# Patient Record
Sex: Male | Born: 1980 | Race: Black or African American | Hispanic: No | Marital: Single | Smoking: Never smoker
Health system: Southern US, Community
[De-identification: ages and names within clinical notes are randomized; demographics above are authoritative.]

## PROBLEM LIST (undated history)

## (undated) DIAGNOSIS — E785 Hyperlipidemia, unspecified: Secondary | ICD-10-CM

## (undated) DIAGNOSIS — Z87442 Personal history of urinary calculi: Secondary | ICD-10-CM

## (undated) HISTORY — PX: LACERATION REPAIR: SHX5168

## (undated) HISTORY — DX: Hyperlipidemia, unspecified: E78.5

---

## 2013-12-08 ENCOUNTER — Encounter: Payer: Self-pay | Admitting: Medical

## 2013-12-08 ENCOUNTER — Ambulatory Visit (INDEPENDENT_AMBULATORY_CARE_PROVIDER_SITE_OTHER): Payer: 59 | Admitting: Medical

## 2013-12-08 VITALS — BP 120/90 | HR 72 | Temp 98.0°F

## 2013-12-08 DIAGNOSIS — M5431 Sciatica, right side: Secondary | ICD-10-CM

## 2013-12-08 DIAGNOSIS — M549 Dorsalgia, unspecified: Secondary | ICD-10-CM

## 2013-12-08 DIAGNOSIS — M5430 Sciatica, unspecified side: Secondary | ICD-10-CM

## 2013-12-08 DIAGNOSIS — R3129 Other microscopic hematuria: Secondary | ICD-10-CM

## 2013-12-08 LAB — POCT URINALYSIS DIPSTICK
Bilirubin, UA: NEGATIVE
Glucose, UA: NEGATIVE
Nitrite, UA: NEGATIVE
Urobilinogen, UA: NEGATIVE

## 2013-12-08 MED ORDER — CYCLOBENZAPRINE HCL 10 MG PO TABS: 10.0000 mg | ORAL_TABLET | Freq: Every day | ORAL | Status: DC

## 2013-12-08 MED ORDER — NAPROXEN 500 MG PO TABS: 500.0000 mg | ORAL_TABLET | Freq: Two times a day (BID) | ORAL | Status: DC

## 2013-12-08 NOTE — Progress Notes (Signed)
  Subjective:  Terrence Santiago is a 32 y.o. male who presents as a new patient today.    He reports pain in right low back over a week ago, earlier in this week had worse pain, but a little better today.  Pain is sharp, worse with raising right leg, worse with back flexion and extension.  Pain worse right in right buttock.  Had some pain in back of right upper leg one day, but none other.   Denies numbness or tingling in leg.   Denies injury or trauma.  He does exercise and works out.  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  Using some Bayer aspirin, no other aggravating or relieving factors.  He notes similar before, but not this bad.  No other c/o.  The following portions of the patient's history were reviewed and updated as appropriate: allergies, current medications, past family history, past medical history, past social history, past surgical history and problem list.  Review of Systems Constitutional: -fever, -chills, -sweats, -unexpected weight change,-fatigue ENT: -runny nose, -ear pain, -sore throat Cardiology:  -chest pain, -palpitations, -edema Respiratory: -cough, -shortness of breath, -wheezing Gastroenterology: -abdominal pain, -nausea, -vomiting, -diarrhea, -constipation  Hematology: -bleeding or bruising problems Musculoskeletal: -arthralgias, -joint swelling Ophthalmology: -vision changes Urology: -dysuria, -difficulty urinating, -hematuria, -urinary frequency, -urgency Neurology: -headache, -weakness, -tingling, -numbness    Objective: Physical Exam  BP 120/90  Pulse 72  Temp(Src) 98 F (36.7 C) (Oral)   General appearance: alert, no distress, WD/WN, lean AA male Abdomen: +bs, soft, non tender, non distended, no masses, no hepatomegaly, no splenomegaly Back: nontender, normal ROM, but he does note some right low back pain with flexion and right leg extension, no scoliosis Pulses: 2+ radial pulses, 2+ pedal  pulses, normal cap refill Ext: no edema Neuro: normal LE strength, sensation, DTRs, heel and toe walk, -SLR   Assessment: Encounter Diagnoses  Name Primary?  . Sciatica of right side Yes  . Back pain   . Microscopic hematuria      Plan: Discussed symptoms, diagnosis of mild sciatica, begin short term flexeril, naprosyn, heat, rest ,stretching.  Consider massage.  Gave some home exercises and stretches to try.   F/u in 1-2 wk if worse or not improving.   Incidentally had microscopic hematuria on urinalysis.   Urine micro revealed red cells, no other findings, casts, etc.  Based on current symptoms and exam, this is unrelated.    Follow up: 2wk for recheck on urine, sooner prn

## 2013-12-08 NOTE — Patient Instructions (Signed)
Sciatica -  Begin Naprosyn twice daily with food.  This is for pain and inflammation.  Use the naprosyn for a week, then just as needed  Use relative rest for the next 4-5 days  You can use heat pad to the back  Consider massage  Begin Flexeril muscle relaxer at bedtime for the next few days, then as needed  Do some gentle stretching of the back and legs.  Usually sciatica will calm down within a week or 2.    Sciatica Sciatica is pain, weakness, numbness, or tingling along the path of the sciatic nerve. The nerve starts in the lower back and runs down the back of each leg. The nerve controls the muscles in the lower leg and in the back of the knee, while also providing sensation to the back of the thigh, lower leg, and the sole of your foot. Sciatica is a symptom of another medical condition. For instance, nerve damage or certain conditions, such as a herniated disk or bone spur on the spine, pinch or put pressure on the sciatic nerve. This causes the pain, weakness, or other sensations normally associated with sciatica. Generally, sciatica only affects one side of the body. CAUSES   Herniated or slipped disc.  Degenerative disk disease.  A pain disorder involving the narrow muscle in the buttocks (piriformis syndrome).  Pelvic injury or fracture.  Pregnancy.  Tumor (rare). SYMPTOMS  Symptoms can vary from mild to very severe. The symptoms usually travel from the low back to the buttocks and down the back of the leg. Symptoms can include:  Mild tingling or dull aches in the lower back, leg, or hip.  Numbness in the back of the calf or sole of the foot.  Burning sensations in the lower back, leg, or hip.  Sharp pains in the lower back, leg, or hip.  Leg weakness.  Severe back pain inhibiting movement. These symptoms may get worse with coughing, sneezing, laughing, or prolonged sitting or standing. Also, being overweight may worsen symptoms. DIAGNOSIS  Your caregiver  will perform a physical exam to look for common symptoms of sciatica. He or she may ask you to do certain movements or activities that would trigger sciatic nerve pain. Other tests may be performed to find the cause of the sciatica. These may include:  Blood tests.  X-rays.  Imaging tests, such as an MRI or CT scan. TREATMENT  Treatment is directed at the cause of the sciatic pain. Sometimes, treatment is not necessary and the pain and discomfort goes away on its own. If treatment is needed, your caregiver may suggest:  Over-the-counter medicines to relieve pain.  Prescription medicines, such as anti-inflammatory medicine, muscle relaxants, or narcotics.  Applying heat or ice to the painful area.  Steroid injections to lessen pain, irritation, and inflammation around the nerve.  Reducing activity during periods of pain.  Exercising and stretching to strengthen your abdomen and improve flexibility of your spine. Your caregiver may suggest losing weight if the extra weight makes the back pain worse.  Physical therapy.  Surgery to eliminate what is pressing or pinching the nerve, such as a bone spur or part of a herniated disk. HOME CARE INSTRUCTIONS   Only take over-the-counter or prescription medicines for pain or discomfort as directed by your caregiver.  Apply ice to the affected area for 20 minutes, 3 4 times a day for the first 48 72 hours. Then try heat in the same way.  Exercise, stretch, or perform your usual  activities if these do not aggravate your pain.  Attend physical therapy sessions as directed by your caregiver.  Keep all follow-up appointments as directed by your caregiver.  Do not wear high heels or shoes that do not provide proper support.  Check your mattress to see if it is too soft. A firm mattress may lessen your pain and discomfort. SEEK IMMEDIATE MEDICAL CARE IF:   You lose control of your bowel or bladder (incontinence).  You have increasing  weakness in the lower back, pelvis, buttocks, or legs.  You have redness or swelling of your back.  You have a burning sensation when you urinate.  You have pain that gets worse when you lie down or awakens you at night.  Your pain is worse than you have experienced in the past.  Your pain is lasting longer than 4 weeks.  You are suddenly losing weight without reason. MAKE SURE YOU:  Understand these instructions.  Will watch your condition.  Will get help right away if you are not doing well or get worse. Document Released: 12/01/2001 Document Revised: 06/07/2012 Document Reviewed: 04/17/2012 [facility] Patient Information 2014 [place], [place].

## 2014-02-14 ENCOUNTER — Telehealth: Payer: Self-pay | Admitting: Medical

## 2014-02-14 NOTE — Telephone Encounter (Signed)
Send naprosyn, set up f/u visit.  He was due back after last visit on the back concerns and microscopic blood in urine.

## 2014-02-14 NOTE — Telephone Encounter (Signed)
Pt needs refill on Naproxen sent to [facility] [address]

## 2014-02-14 NOTE — Telephone Encounter (Signed)
Patient is aware and appointment is set up. CLS

## 2014-02-15 ENCOUNTER — Other Ambulatory Visit: Payer: Self-pay | Admitting: Medical

## 2014-02-16 ENCOUNTER — Ambulatory Visit: Payer: Self-pay | Admitting: Medical

## 2014-02-16 NOTE — Telephone Encounter (Signed)
IS THIS OKAY TO REFILL 

## 2014-05-23 ENCOUNTER — Ambulatory Visit (INDEPENDENT_AMBULATORY_CARE_PROVIDER_SITE_OTHER): Payer: 59 | Admitting: Medical

## 2014-05-23 ENCOUNTER — Encounter: Payer: Self-pay | Admitting: Medical

## 2014-05-23 ENCOUNTER — Telehealth: Payer: Self-pay | Admitting: Medical

## 2014-05-23 VITALS — BP 110/80 | HR 68 | Temp 98.1°F | Resp 14 | Wt 172.0 lb

## 2014-05-23 DIAGNOSIS — M7918 Myalgia, other site: Secondary | ICD-10-CM

## 2014-05-23 DIAGNOSIS — IMO0001 Reserved for inherently not codable concepts without codable children: Secondary | ICD-10-CM

## 2014-05-23 DIAGNOSIS — M549 Dorsalgia, unspecified: Secondary | ICD-10-CM

## 2014-05-23 DIAGNOSIS — R29898 Other symptoms and signs involving the musculoskeletal system: Secondary | ICD-10-CM

## 2014-05-23 DIAGNOSIS — M25651 Stiffness of right hip, not elsewhere classified: Secondary | ICD-10-CM

## 2014-05-23 MED ORDER — NAPROXEN 500 MG PO TABS: 500.0000 mg | ORAL_TABLET | Freq: Two times a day (BID) | ORAL | Status: DC

## 2014-05-23 MED ORDER — METHOCARBAMOL 500 MG PO TABS: 500.0000 mg | ORAL_TABLET | Freq: Four times a day (QID) | ORAL | Status: DC

## 2014-05-23 NOTE — Telephone Encounter (Signed)
I fax over his information to [facility] and they will contact him about his appointment. CLS

## 2014-05-23 NOTE — Telephone Encounter (Signed)
Refer to physical therapy for buttock and back pain, sciatica vs piriformis inflammation

## 2014-05-23 NOTE — Progress Notes (Signed)
  Subjective:  Terrence Santiago is a 32 y.o. male who presents for back pain.  Currently he reports back pain gradually coming on the last few days.  Last night more notable, hurts to turn over in the bed.  Denies specific recent injury.  Pains are intermittent, comes and goes.  But this is the worse it has been in recent months.  He does wear a heavy belt on the job, uses steps often at work, not sure if those are related/aggravating.   Pain is currently 8/10.   Hard to get out of the bed last night due to pain.  Flexion and extension aggravates the pain, getting in and out of the car, walking.  Pain is still right low back and buttock, radiates down right leg.    I saw him in December for similar.  At that time he was having a week of similar right low back and buttock pain.  Denies numbness or tingling in leg.   Denies injury or trauma.  He does exercise and works out.  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    Uses stretching.  Does marital arts regularly.  Exercises a few times per week.  Went swimming the other day.  Hasn't seen massage therapy.  After last visit did naprosyn, heat, stretching, used Flexeril for 2 days only.  No other aggravating or relieving factors.  No other c/o.  The following portions of the patient's history were reviewed and updated as appropriate: allergies, current medications, past family history, past medical history, past social history, past surgical history and problem list.  Review of Systems Constitutional: -fever, -chills, -sweats, -unexpected weight change,-fatigue ENT: -runny nose, -ear pain, -sore throat Cardiology:  -chest pain, -palpitations, -edema Respiratory: -cough, -shortness of breath, -wheezing Gastroenterology: -abdominal pain, -nausea, -vomiting, -diarrhea, -constipation  Hematology: -bleeding or bruising problems Musculoskeletal: -arthralgias, -joint swelling Ophthalmology: -vision  changes Urology: -dysuria, -difficulty urinating, -hematuria, -urinary frequency, -urgency Neurology: -headache, -weakness, -tingling, -numbness    Objective: Physical Exam  BP 110/80  Pulse 68  Temp(Src) 98.1 F (36.7 C) (Oral)  Resp 14  Wt 172 lb (78.019 kg)   General appearance: alert, no distress, WD/WN, lean AA male Abdomen: +bs, soft, non tender, non distended, no masses, no hepatomegaly, no splenomegaly Back: nontender, flexoin and extension limited due to pain, no scoliosis Pulses: 2+ radial pulses, 2+ pedal pulses, normal cap refill Ext: no edema Neuro: normal LE strength, sensation, DTRs, heel and toe walk, left SLR radiates pain to right buttock, right SLT similar pain to right buttock, however left SLR pain with 20 degrees, right SLR + at 45 degrees MSK: decreased right hip internal and external ROM due to pain radiating into buttock.  Tender over right buttock.  Otherwise LE unremarkable   Assessment: Encounter Diagnoses  Name Primary?  . Back pain Yes  . Buttock pain   . Decreased range of right hip movement      Plan: Discussed symptoms, exam findings.  Etiology suggestive of sciatica vs piriformis inflammation, but interestingly right hip ROM somewhat reduced today compared to 11/2013 visit for similar.  Referral to PT.  Refilled NSAID, muscle relaxer to use, advised gentle stretching and ROM activity, note given for work for today and tomorrow.  F/u with therapy, recheck here in 3 wk.

## 2014-07-03 ENCOUNTER — Ambulatory Visit (INDEPENDENT_AMBULATORY_CARE_PROVIDER_SITE_OTHER): Payer: 59 | Admitting: Medical

## 2014-07-03 ENCOUNTER — Encounter: Payer: Self-pay | Admitting: Medical

## 2014-07-03 VITALS — BP 112/80 | HR 72 | Resp 12 | Ht 69.0 in | Wt 170.0 lb

## 2014-07-03 DIAGNOSIS — R29898 Other symptoms and signs involving the musculoskeletal system: Secondary | ICD-10-CM

## 2014-07-03 DIAGNOSIS — M545 Low back pain, unspecified: Secondary | ICD-10-CM

## 2014-07-03 DIAGNOSIS — IMO0001 Reserved for inherently not codable concepts without codable children: Secondary | ICD-10-CM

## 2014-07-03 DIAGNOSIS — M24651 Ankylosis, right hip: Secondary | ICD-10-CM

## 2014-07-03 DIAGNOSIS — M7918 Myalgia, other site: Secondary | ICD-10-CM

## 2014-07-03 NOTE — Patient Instructions (Addendum)
Back pain  Recommendations:  Go for massage, have them focus on low back and buttock  Continue anti-inflammatory daily  Use flexeril as needed  Continue physical therapy  Move some of your work belt items to the other side  If not seeing improvements in the next few weeks, we can get some xrays   Massage Therapy:  Sharen HintJanet Blevins [facility] [address] [place], [place] [address] [phone] [email or web address]   [facility] [address] [place], [place] [address] [phone]

## 2014-07-03 NOTE — Progress Notes (Signed)
   Subjective:    Patient ID: Terrence Santiago, male    DOB: 1981/11/26, 33 y.o.   MRN: [id number]  HPI  Patient is presenting for ongoing back stiffness. Was previously seen here and thought to have sciatica vs piriformis inflammation. He did go to PT, 2 sessions, last one 3 weeks ago, has been stretching as instructed by PT. Has not used topical medications or oral medications prescribed in the past.   Today, he complains of stiffness, tightness in his right lower back/flank. Denies pain but explains that it is just uncomfortable. At times, patient feels some numbness in his right thigh sometimes aggravated by walking up stairs. Patient states that he is able to do his ADL's but some times he feels limited. Denies trauma or injury, weakness, shooting type pain, urinary or fecal incontinence. He also reports that he has avoided exercising including martial arts to prevent further symptoms. Has not tried heating pads, ice, massages.   No other aggravating or relieving factors.  Review of Systems As in subjective.    Objective:   Physical Exam  BP 112/80  Pulse 72  Resp 12  Ht 5\' 9"  (1.753 m)  Wt 170 lb (77.111 kg)  BMI 25.09 kg/m2    General appearance: alert, no distress, WD/WN  Back: non tender, normal ROM, limited pain on ROM, no scoliosis Musculoskeletal: nontender legs, decreased right external hip rotation otherwise normal ROM throughout, negative straight leg raise Extremities: no edema Neuro - normal LE strength, sensation, DTRs. Pulses normal   Assessment and Plan :    Encounter Diagnoses  Name Primary?  . Right-sided low back pain without sciatica Yes  . Right buttock pain   . Decreased range of hip movement, right    We again discussed differential for his back and buttock pain.  At this point he has completed 3 physical therapy sessions. He is improved on exam findings today, and seems somewhat improved regarding symptoms. Offered x-rays but he  declines.  Recommendations:  Go for massage, have them focus on low back and buttock  Continue anti-inflammatory daily  Use flexeril as needed  Continue physical therapy  Move some of your work belt items to the other side  If not seeing improvements in the next few weeks, we can get some xrays  Call or recheck in 3-4 weeks

## 2014-11-21 ENCOUNTER — Ambulatory Visit (INDEPENDENT_AMBULATORY_CARE_PROVIDER_SITE_OTHER): Payer: 59 | Admitting: Family Medicine

## 2014-11-21 ENCOUNTER — Encounter: Payer: Self-pay | Admitting: Family Medicine

## 2014-11-21 VITALS — BP 140/80 | HR 68 | Temp 97.8°F | Ht 69.0 in | Wt 177.0 lb

## 2014-11-21 DIAGNOSIS — Z23 Encounter for immunization: Secondary | ICD-10-CM

## 2014-11-21 DIAGNOSIS — L0291 Cutaneous abscess, unspecified: Secondary | ICD-10-CM

## 2014-11-21 DIAGNOSIS — L0390 Cellulitis, unspecified: Secondary | ICD-10-CM

## 2014-11-21 NOTE — Progress Notes (Signed)
Chief Complaint  Patient presents with  . Cellulitis    [facility]-given rx for bactrim-is taking. Concerned today because skin infection seems to have changed and is now draining. Would like flu shot today but is unsure if he can take, would like to discuss as he is lactose intolerant-eggs do give him GI symptoms.    [facility] [address] on [facility]29 and diagnosed with cellulitis at his left elbow.  It was red and swollen. He reports there was a white pustule.  He was treated with Bactrim, and he is taking this without side effects.  Swelling has decreased; it no longer hurts to move/bend his left elbow.  He presents today because he is concerned that it looks different.  It is "scabby", looks different than it did.  He denies any drainage.  No fevers, chills.  He has noted slight discomfort/"strain" in his left axilla.  PMH, PSH, SH reviewed  Outpatient Encounter Prescriptions as of 11/21/2014  Medication Sig  . sulfamethoxazole-trimethoprim (BACTRIM DS,SEPTRA DS) 800-160 MG per tablet Take 1 tablet by mouth 2 (two) times daily.   No Known Allergies  ROS:  No fever, chills, headache, dizziness, nausea, vomiting, diarrhea, chest pain, shortness of breath or URI symptoms.  PHYSICAL EXAM: BP 140/80 mmHg  Pulse 68  Temp(Src) 97.8 F (36.6 C) (Tympanic)  Ht 5\' 9"  (1.753 m)  Wt 177 lb (80.287 kg)  BMI 26.13 kg/m2  Well developed, pleasant male in no distress Evaluation of left elbow:  There is loose/dead skin in a 2.25x2cm area--this was a blister/vesicle that has ruptured.  There is some serous drainage.  There are a few thicker white mucusy/particulate areas within the blister that are easily moved around (and drain through the opening).  The surrounding skin appears normal--no erythema or swelling.  FROM at the elbow without evidence of bursitis.  There is no axillary lymphadenopathy  ASSESSMENT/PLAN:  Cellulitis and abscess - left elbow,  resolving  Cellulitis/abscess--this seems to be significantly improved, per history (with less swelling, redness and pain), and has now spontaneously drained, leaving some dead skin intact.  Given that this area is right over where the infection was, and denies any oral blisters or other skin lesions, this is likely related to healing of infection (and not due to allergy to antibiotic).  Wound care instructions reviewed. Complete course of antibiotic.  F/u prn persistent/worsening symptoms.  Flu shot given

## 2014-11-21 NOTE — Patient Instructions (Signed)
Complete the course of antibiotics. Keep the skin clean--consider soapy water soaks twice daily.  Keep covered with antibacterial ointment and a bandage (to protect it from further trauma).  Return if you develop fever, recurrent redness, swelling, pain, new skin lesions or other concerns.

## 2014-11-22 NOTE — Addendum Note (Signed)
Addended by: Debbrah AlarSMITH, Vinal Rosengrant F on: 11/22/2014 08:09 AM   Modules accepted: Orders

## 2015-01-18 ENCOUNTER — Encounter: Payer: Self-pay | Admitting: Medical

## 2015-01-18 ENCOUNTER — Ambulatory Visit (INDEPENDENT_AMBULATORY_CARE_PROVIDER_SITE_OTHER): Payer: 59 | Admitting: Medical

## 2015-01-18 VITALS — BP 132/82 | HR 64 | Temp 98.2°F | Resp 16 | Wt 179.0 lb

## 2015-01-18 DIAGNOSIS — H65192 Other acute nonsuppurative otitis media, left ear: Secondary | ICD-10-CM

## 2015-01-18 DIAGNOSIS — J069 Acute upper respiratory infection, unspecified: Secondary | ICD-10-CM

## 2015-01-18 MED ORDER — ANTIPYRINE-BENZOCAINE 5.4-1.4 % OT SOLN: 3.0000 [drp] | OTIC | Status: DC | PRN

## 2015-01-18 MED ORDER — AMOXICILLIN 500 MG PO TABS: ORAL_TABLET | ORAL | Status: DC

## 2015-01-18 NOTE — Progress Notes (Signed)
Subjective:   Terrence Santiago is a 34 y.o. male who presents with left ear pain and possible ear infection. Symptoms include: left ear pain, congestion, coryza, cough and sore throat. Onset of symptoms was 4 days ago, and have been gradually worsening since that time. Associated symptoms include: none.  Patient denies: chills and fever . He is drinking plenty of fluids.  Treatment to date: decongestants.  Denies sick contacts.  No other aggravating or relieving factors.  No other c/o.  ROS as in subjective  Objective:  Filed Vitals:   01/18/15 1411  BP: 132/82  Pulse: 64  Temp: 98.2 F (36.8 C)  Resp: 16    General appearance: Alert, WD/WN, no distress, mildly ill appearing                             Skin: warm, no rash                           Head: no sinus tenderness                            Eyes: conjunctiva normal, corneas clear, PERRLA                            Ears: left TM with erytehma, bulging, right TM normal, external ear canals normal                          Nose: septum midline, turbinates swollen, with erythema and clear discharge             Mouth/throat: MMM, tongue normal, mild pharyngeal erythema                           Neck: supple, no adenopathy, no thyromegaly, nontender                          Heart: RRR, normal S1, S2, no murmurs                         Lungs: CTA bilaterally, no wheezes, rales, or rhonchi     Assessment: Encounter Diagnoses  Name Primary?  . Acute nonsuppurative otitis media of left ear Yes  . Acute upper respiratory infection     Plan: Prescription for Amoxicillin and antipyrene drops given as below.  Discussed diagnosis and treatment of otitis media.  Suggested symptomatic OTC remedies.  Tylenol or Ibuprofen OTC for fever and malaise.  Call/return in 2-3 days if symptoms aren't resolving.

## 2016-02-07 ENCOUNTER — Ambulatory Visit (INDEPENDENT_AMBULATORY_CARE_PROVIDER_SITE_OTHER): Payer: 59 | Admitting: Medical

## 2016-02-07 ENCOUNTER — Encounter: Payer: Self-pay | Admitting: Medical

## 2016-02-07 VITALS — BP 108/80 | HR 81 | Wt 181.0 lb

## 2016-02-07 DIAGNOSIS — M549 Dorsalgia, unspecified: Secondary | ICD-10-CM

## 2016-02-07 LAB — POCT URINALYSIS DIPSTICK
Bilirubin, UA: NEGATIVE
Blood, UA: NEGATIVE
GLUCOSE UA: NEGATIVE
Ketones, UA: NEGATIVE
Leukocytes, UA: NEGATIVE
NITRITE UA: NEGATIVE
Protein, UA: NEGATIVE
SPEC GRAV UA: 1.025
UROBILINOGEN UA: NEGATIVE
pH, UA: 6

## 2016-02-07 MED ORDER — CYCLOBENZAPRINE HCL 10 MG PO TABS: 10.0000 mg | ORAL_TABLET | Freq: Every day | ORAL | Status: DC

## 2016-02-07 NOTE — Patient Instructions (Signed)
Massage Therapy:  Terrence Santiago [facility] [address] [place], [place] [address] [phone] [email or web address]   

## 2016-02-07 NOTE — Progress Notes (Signed)
Subjective: Chief Complaint  Patient presents with  . Back Pain    has been going on for about a week. states that he is not sure what caused it maybe from lifting but not sure.    Here for back pain.  Pain is lower left. Not sure if he pulled a muscle, but started a week ago.   Not like a sciatica pain, more of a milder annoying pain.  No recent injury, trauma, or specific strenuous activity.  Does work in a jail.  No fever, no  Urinary or bowel changes, no blood in urine or stool, no leg pain, no paresthesias.   Using a few Aleve.  Doesn't stretch regularly, hasn't been doing any weight lifting.  Walks at the jail, works at the jail. No other aggravating or relieving factors. No other complaint.  History reviewed. No pertinent past medical history.  ROS as in subjective   Objective: BP 108/80 mmHg  Pulse 81  Wt 181 lb (82.101 kg)  Gen: wd, wn, nad Back: mild focal tenderness in left mid to lower back paraspinal.  No midline pain, no deformity, no scoliosis Legs nontender, mild reduced ROM of right hip in general, but otherwise normal hip room, rest of leg exam unremarkable No LE edema Legs neurovascularly intact Skin: no rash Abdomen:+bs, soft, nontender, no mass, no organomegaly   Assessment: Encounter Diagnosis  Name Primary?  . Acute back pain Yes    Plan: Advised few days of relative rest, hydration, heat, Aleve OTC, flexeril as below.  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   Discussed being proactive at keeping back and muscles in good shape.

## 2016-05-08 ENCOUNTER — Ambulatory Visit (INDEPENDENT_AMBULATORY_CARE_PROVIDER_SITE_OTHER): Payer: 59 | Admitting: Family Medicine

## 2016-05-08 ENCOUNTER — Encounter: Payer: Self-pay | Admitting: Family Medicine

## 2016-05-08 VITALS — BP 130/90 | HR 80 | Temp 98.2°F | Wt 180.0 lb

## 2016-05-08 DIAGNOSIS — J029 Acute pharyngitis, unspecified: Secondary | ICD-10-CM

## 2016-05-08 LAB — POCT RAPID STREP A (OFFICE): RAPID STREP A SCREEN: NEGATIVE

## 2016-05-08 NOTE — Progress Notes (Signed)
   Subjective:    Patient ID: Terrence RaisinKenneth Roadcap, male    DOB: [birth year], 35 y.o.   MRN: [id number]  HPI  he complains of a one-day history of sore throat but no fever, chills, earache or malaise.   Review of Systems     Objective:   Physical Exam Alert and in no distress. Tympanic membranes and canals are normal. Pharyngeal area is normal. Neck is supple without adenopathy or thyromegaly. Cardiac exam shows a regular sinus rhythm without murmurs or gallops. Lungs are clear to auscultation.   Strep screen negative       Assessment & Plan:  Acute pharyngitis, unspecified etiology - Plan: Rapid Strep A   recommend supportive care.

## 2016-08-07 ENCOUNTER — Ambulatory Visit (INDEPENDENT_AMBULATORY_CARE_PROVIDER_SITE_OTHER): Payer: 59 | Admitting: Psychology

## 2016-08-07 DIAGNOSIS — F4323 Adjustment disorder with mixed anxiety and depressed mood: Secondary | ICD-10-CM | POA: Diagnosis not present

## 2016-08-17 ENCOUNTER — Ambulatory Visit (INDEPENDENT_AMBULATORY_CARE_PROVIDER_SITE_OTHER): Payer: 59 | Admitting: Psychology

## 2016-08-17 DIAGNOSIS — F4323 Adjustment disorder with mixed anxiety and depressed mood: Secondary | ICD-10-CM | POA: Diagnosis not present

## 2016-08-19 ENCOUNTER — Ambulatory Visit (INDEPENDENT_AMBULATORY_CARE_PROVIDER_SITE_OTHER): Payer: 59 | Admitting: Psychology

## 2016-08-19 DIAGNOSIS — F4323 Adjustment disorder with mixed anxiety and depressed mood: Secondary | ICD-10-CM | POA: Diagnosis not present

## 2016-09-09 ENCOUNTER — Ambulatory Visit: Payer: 59 | Admitting: Psychology

## 2017-03-24 ENCOUNTER — Ambulatory Visit (INDEPENDENT_AMBULATORY_CARE_PROVIDER_SITE_OTHER): Payer: 59 | Admitting: Family Medicine

## 2017-03-24 ENCOUNTER — Encounter: Payer: Self-pay | Admitting: Family Medicine

## 2017-03-24 VITALS — BP 136/80 | HR 68 | Wt 185.0 lb

## 2017-03-24 DIAGNOSIS — R030 Elevated blood-pressure reading, without diagnosis of hypertension: Secondary | ICD-10-CM

## 2017-03-24 NOTE — Progress Notes (Signed)
   Subjective:    Patient ID: Terrence Santiago, male    DOB: 1981/04/29, 36 y.o.   MRN: [id number]  HPI Chief Complaint  Patient presents with  . elevated bp    elevated BP, checked bp 10 days ago 155 over something but trying to check it over the last couple days and 146/    He is a patient of Vincenza Hews, [place] who is here today to discuss recent elevated BP 2 weeks ago while at work. 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 States his job is stressful. States he wasn't feeling "right" but cannot describe how he was feeling so he had the nurse check his BP.  States he was told his readings were 155/ ? Then 148/?  Denies history of HTN.  Denies fever, chills, dizziness, headache, vision changes, chest pain, palpitations, shortness of breath, cough, abdominal pain, N/V/D.   Family history of HTN in father and mother. Denies personal or family history of heart disease.    Reviewed allergies, medications, past medical, surgical, family, and social history.   Review of Systems Pertinent positives and negatives in the history of present illness.     Objective:   Physical Exam BP 136/80   Pulse 68   Wt 185 lb (83.9 kg)   BMI 27.32 kg/m  Alert and in no distress.  Pharyngeal area is normal. Neck is supple without adenopathy or thyromegaly. Cardiac exam shows a regular sinus rhythm without murmurs or gallops. Lungs are clear to auscultation. extremites without edema, normal pulses.       Assessment & Plan:  Elevated BP without diagnosis of hypertension  Discussed that his BP is close to goal range but is slightly elevated. He is asymptomatic.  Encouraged him to buy a BP cuff and to check it once daily for the next 2 weeks. He will call if he is seeing elevated readings >130/80, but encouraged him to not check his BP at work only and to check it when he is at home resting.  Counseled on the proper way to check an accurate BP.  Counseled on healthy lifestyle management for blood pressure. He does not  smoke or drink alcohol. He has been making healthy dietary changes and increased exercise already.  Follow up in 4 weeks with Vincenza Hews. He will bring in BP readings and his BP cuff.

## 2017-03-24 NOTE — Patient Instructions (Addendum)
Goal BP is <130/80 on a regular basis.  Continue eating a healthy diet and exercising.  Call if your readings are significantly higher than goal on more than one occasion.   Return in 1 month. Bring in BP readings and BP cuff.

## 2017-04-21 ENCOUNTER — Telehealth: Payer: Self-pay | Admitting: Medical

## 2017-04-21 ENCOUNTER — Encounter: Payer: Self-pay | Admitting: Medical

## 2017-04-21 ENCOUNTER — Ambulatory Visit (INDEPENDENT_AMBULATORY_CARE_PROVIDER_SITE_OTHER): Payer: 59 | Admitting: Medical

## 2017-04-21 VITALS — BP 110/80 | HR 71 | Wt 186.2 lb

## 2017-04-21 DIAGNOSIS — Z7189 Other specified counseling: Secondary | ICD-10-CM

## 2017-04-21 DIAGNOSIS — Z0000 Encounter for general adult medical examination without abnormal findings: Secondary | ICD-10-CM

## 2017-04-21 DIAGNOSIS — R030 Elevated blood-pressure reading, without diagnosis of hypertension: Secondary | ICD-10-CM | POA: Insufficient documentation

## 2017-04-21 DIAGNOSIS — Z7185 Encounter for immunization safety counseling: Secondary | ICD-10-CM | POA: Insufficient documentation

## 2017-04-21 DIAGNOSIS — M549 Dorsalgia, unspecified: Secondary | ICD-10-CM | POA: Diagnosis not present

## 2017-04-21 DIAGNOSIS — Z13220 Encounter for screening for lipoid disorders: Secondary | ICD-10-CM | POA: Insufficient documentation

## 2017-04-21 LAB — POCT URINALYSIS DIPSTICK
Bilirubin, UA: NEGATIVE
GLUCOSE UA: NEGATIVE
Ketones, UA: NEGATIVE
Leukocytes, UA: NEGATIVE
NITRITE UA: NEGATIVE
Protein, UA: NEGATIVE
RBC UA: NEGATIVE
Spec Grav, UA: 1.02 (ref 1.010–1.025)
UROBILINOGEN UA: NEGATIVE U/dL — AB
pH, UA: 6 (ref 5.0–8.0)

## 2017-04-21 NOTE — Telephone Encounter (Signed)
Please call him and let him know the contact info below   Massage Therapy:  Sharen Hint [facility] [address] [place] [address] [place], [place] [address] [phone] [email or web address]

## 2017-04-21 NOTE — Progress Notes (Signed)
HPI    Review of Systems        Physical Exam

## 2017-04-21 NOTE — Patient Instructions (Signed)
Thank you for giving me the opportunity to serve you today.   Your diagnoses today includes: Encounter Diagnoses  Name Primary?  . Encounter for health maintenance examination in adult Yes  . Elevated blood-pressure reading without diagnosis of hypertension   . Vaccine counseling   . Back pain, unspecified back location, unspecified back pain laterality, unspecified chronicity     Specific recommendations today include  Exercise most days per week, preferably at least 30 minutes every day  Please get me a copy of your vaccine records  Return fasting for labs within 7 days  Vaccine recommendations: Tetanus diptheria and pertussis, Tdap Hepatitis A and B vaccination Yearly flu shot    I have included other useful information below for your review.     Preventative Care for Adults, Male       REGULAR HEALTH EXAMS:  A routine yearly physical is a good way to check in with your primary care provider about your health and preventive screening. It is also an opportunity to share updates about your health and any concerns you have, and receive a thorough all-over exam.   Most health insurance companies pay for at least some preventative services.  Check with your health plan for specific coverages.  WHAT PREVENTATIVE SERVICES DO MEN NEED?  Adult men should have their weight and blood pressure checked regularly.   Men age 56 and older should have their cholesterol levels checked regularly.  Beginning at age 61 and continuing to age 42, men should be screened for colorectal cancer.  Certain people should may need continued testing until age 42.  Other cancer screening may include exams for testicular and prostate cancer.  Updating vaccinations is part of preventative care.  Vaccinations help protect against diseases such as the flu.  Lab tests are generally done as part of preventative care to screen for anemia and blood disorders, to screen for problems with the kidneys and  liver, to screen for bladder problems, to check blood sugar, and to check your cholesterol level.  Preventative services generally include counseling about diet, exercise, avoiding tobacco, drugs, excessive alcohol consumption, and sexually transmitted infections.    GENERAL RECOMMENDATIONS FOR GOOD HEALTH:  Healthy diet:  Eat a variety of foods, including fruit, vegetables, animal or vegetable protein, such as meat, fish, chicken, and eggs, or beans, lentils, tofu, and grains, such as rice.  Drink plenty of water daily.  Decrease saturated fat in the diet, avoid lots of red meat, processed foods, sweets, fast foods, and fried foods.  Exercise:  Aerobic exercise helps maintain good heart health. At least 30-40 minutes of moderate-intensity exercise is recommended. For example, a brisk walk that increases your heart rate and breathing. This should be done on most days of the week.   Find a type of exercise or a variety of exercises that you enjoy so that it becomes a part of your daily life.  Examples are running, walking, swimming, water aerobics, and biking.  For motivation and support, explore group exercise such as aerobic class, spin class, Zumba, Yoga,or  martial arts, etc.    Set exercise goals for yourself, such as a certain weight goal, walk or run in a race such as a 5k walk/run.  Speak to your primary care provider about exercise goals.  Disease prevention:  If you smoke or chew tobacco, find out from your caregiver how to quit. It can literally save your life, no matter how long you have been a tobacco user. If you do  not use tobacco, never begin.   Maintain a healthy diet and normal weight. Increased weight leads to problems with blood pressure and diabetes.   The Body Mass Index or BMI is a way of measuring how much of your body is fat. Having a BMI above 27 increases the risk of heart disease, diabetes, hypertension, stroke and other problems related to obesity. Your  caregiver can help determine your BMI and based on it develop an exercise and dietary program to help you achieve or maintain this important measurement at a healthful level.  High blood pressure causes heart and blood vessel problems.  Persistent high blood pressure should be treated with medicine if weight loss and exercise do not work.   Fat and cholesterol leaves deposits in your arteries that can block them. This causes heart disease and vessel disease elsewhere in your body.  If your cholesterol is found to be high, or if you have heart disease or certain other medical conditions, then you may need to have your cholesterol monitored frequently and be treated with medication.   Ask if you should have a stress test if your history suggests this. A stress test is a test done on a treadmill that looks for heart disease. This test can find disease prior to there being a problem.  Avoid drinking alcohol in excess (more than two drinks per day).  Avoid use of street drugs. Do not share needles with anyone. Ask for professional help if you need assistance or instructions on stopping the use of alcohol, cigarettes, and/or drugs.  Brush your teeth twice a day with fluoride toothpaste, and floss once a day. Good oral hygiene prevents tooth decay and gum disease. The problems can be painful, unattractive, and can cause other health problems. Visit your dentist for a routine oral and dental check up and preventive care every 6-12 months.   Look at your skin regularly.  Use a mirror to look at your back. Notify your caregivers of changes in moles, especially if there are changes in shapes, colors, a size larger than a pencil eraser, an irregular border, or development of new moles.  Safety:  Use seatbelts 100% of the time, whether driving or as a passenger.  Use safety devices such as hearing protection if you work in environments with loud noise or significant background noise.  Use safety glasses when  doing any work that could send debris in to the eyes.  Use a helmet if you ride a bike or motorcycle.  Use appropriate safety gear for contact sports.  Talk to your caregiver about gun safety.  Use sunscreen with a SPF (or skin protection factor) of 15 or greater.  Lighter skinned people are at a greater risk of skin cancer. Don't forget to also wear sunglasses in order to protect your eyes from too much damaging sunlight. Damaging sunlight can accelerate cataract formation.   Practice safe sex. Use condoms. Condoms are used for birth control and to help reduce the spread of sexually transmitted infections (or STIs).  Some of the STIs are gonorrhea (the clap), chlamydia, syphilis, trichomonas, herpes, HPV (human papilloma virus) and HIV (human immunodeficiency virus) which causes AIDS. The herpes, HIV and HPV are viral illnesses that have no cure. These can result in disability, cancer and death.   Keep carbon monoxide and smoke detectors in your home functioning at all times. Change the batteries every 6 months or use a model that plugs into the wall.   Vaccinations:  Stay up  to date with your tetanus shots and other required immunizations. You should have a booster for tetanus every 10 years. Be sure to get your flu shot every year, since 5%-20% of the [place] population comes down with the flu. The flu vaccine changes each year, so being vaccinated once is not enough. Get your shot in the fall, before the flu season peaks.   Other vaccines to consider:  Pneumococcal vaccine to protect against certain types of pneumonia.  This is normally recommended for adults age 27 or older.  However, adults younger than 35 years old with certain underlying conditions such as diabetes, heart or lung disease should also receive the vaccine.  Shingles vaccine to protect against Varicella Zoster if you are older than age 38, or younger than 35 years old with certain underlying illness.  Hepatitis A vaccine to protect  against a form of infection of the liver by a virus acquired from food.  Hepatitis B vaccine to protect against a form of infection of the liver by a virus acquired from blood or body fluids, particularly if you work in health care.  If you plan to travel internationally, check with your local health department for specific vaccination recommendations.  Cancer Screening:  Most routine colon cancer screening begins at the age of 64. On a yearly basis, doctors may provide special easy to use take-home tests to check for hidden blood in the stool. Sigmoidoscopy or colonoscopy can detect the earliest forms of colon cancer and is life saving. These tests use a small camera at the end of a tube to directly examine the colon. Speak to your caregiver about this at age 61, when routine screening begins (and is repeated every 5 years unless early forms of pre-cancerous polyps or small growths are found).   At the age of 71 men usually start screening for prostate cancer every year. Screening may begin at a younger age for those with higher risk. Those at higher risk include African-Americans or having a family history of prostate cancer. There are two types of tests for prostate cancer:   Prostate-specific antigen (PSA) testing. Recent studies raise questions about prostate cancer using PSA and you should discuss this with your caregiver.   Digital rectal exam (in which your doctor's lubricated and gloved finger feels for enlargement of the prostate through the anus).   Screening for testicular cancer.  Do a monthly exam of your testicles. Gently roll each testicle between your thumb and fingers, feeling for any abnormal lumps. The best time to do this is after a hot shower or bath when the tissues are looser. Notify your caregivers of any lumps, tenderness or changes in size or shape immediately.

## 2017-04-21 NOTE — Progress Notes (Signed)
Subjective:    Patient ID: Terrence Santiago, male    DOB: April 05, 1981, 36 y.o.   MRN: [id number]  HPI Chief Complaint  Patient presents with  . Follow-up    follow up from b/p   . Annual Exam   Medical team: Eye doctor, dentist, and [place], DAVID Conneaut, PA-C for primary care  Concerns: Here for f/u on blood pressure.  Came in recently for not feeling well, BP had been elevated at work and here last visit. About a month ago he wasn't feeling right, had BP checked at work and it was high.   That prompted his last visit.  Works as a Glass blower/designer.    Review of Systems  History reviewed. No pertinent past medical history.  Past Surgical History:  Procedure Laterality Date  . LACERATION REPAIR     left hand    Social History   Social History  . Marital status: Single    Spouse name: N/A  . Number of children: N/A  . Years of education: N/A   Occupational History  . [employer]   Social History Main Topics  . Smoking status: Never Smoker  . Smokeless tobacco: Never Used  . Alcohol use 0.0 oz/week     Comment: rare  . Drug use: No  . Sexual activity: Not on file   Other Topics Concern  . Not on file   Social History Narrative   Lives with wife and 2 children and mother in Social [place], works as  Glass blower/designer.  Exercise - circuit training, marital arts, tae kwon do.  04/2017.      Family History  Problem Relation Age of Onset  . Hypertension Father   . Heart disease Father     MI  . Cancer Maternal Uncle   . Diabetes Maternal Grandmother   . Stroke Neg Hx      Current Outpatient Prescriptions:  Marland Kitchen  Multiple Vitamin (MULTIVITAMIN) tablet, Take 1 tablet by mouth daily., Disp: , Rfl:   No Known Allergies      Objective:   Physical Exam    BP 110/80   Pulse 71   Wt 186 lb 3.2 oz (84.5 kg)   SpO2 97%   BMI 27.50 kg/m   General appearance: alert, no distress, WD/WN, AA male Skin: tattoo right upper arm, left neck with 3mm x 3mm triangular brown flat macule,  right volar hand at base of 5th finger with 1mm x 2mm brown flat lesions, no worrisome lesions otherwise HEENT: normocephalic, conjunctiva/corneas normal, sclerae anicteric, PERRLA, EOMi, nares patent, no discharge or erythema, pharynx normal Oral cavity: MMM, tongue normal, teeth normal Neck: supple, no lymphadenopathy, no thyromegaly, no masses, normal ROM, no bruits Chest: non tender, normal shape and expansion Heart: RRR, normal S1, S2, no murmurs Lungs: CTA bilaterally, no wheezes, rhonchi, or rales Abdomen: +bs, soft, non tender, non distended, no masses, no hepatomegaly, no splenomegaly, no bruits Back: non tender, normal ROM, no scoliosis Musculoskeletal: upper extremities non tender, no obvious deformity, normal ROM throughout, lower extremities non tender, no obvious deformity, normal ROM throughout Extremities: no edema, no cyanosis, no clubbing Pulses: 2+ symmetric, upper and lower extremities, normal cap refill Neurological: alert, oriented x 3, CN2-12 intact, strength normal upper extremities and lower extremities, sensation normal throughout, DTRs 2+ throughout, no cerebellar signs, gait normal Psychiatric: normal affect, behavior normal, pleasant  GU: normal male external genitalia, circumcised, nontender, no masses, no hernia, no lymphadenopathy Rectal: deferred   Assessment and Plan :  Encounter Diagnoses  Name Primary?  . Encounter for health maintenance examination in adult Yes  . Elevated blood-pressure reading without diagnosis of hypertension   . Vaccine counseling   . Back pain, unspecified back location, unspecified back pain laterality, unspecified chronicity      Physical exam - discussed healthy lifestyle, diet, exercise, preventative care, vaccinations, and addressed their concerns.   Advised monthly testicular cancer screening/self exam He will get me copy of vaccines at work Return for fating labs  Recommendations:  See your eye doctor yearly for  routine vision care.  See your dentist yearly for routine dental care including hygiene visits twice yearly.  Eat a healthy low fat diet, get a variety of fruits and vegetables, don't eat meat every day, limit salt, and limit sweets.  Exercise regularly, preferably 40-60 minutes daily with aerobic exercise.  Get some weight bearing exercise a few times per week as well  F/u for fasting labs BP normal today  Vaccine recommendations: Tetanus diptheria and pertussis, Tdap Hepatitis A and B vaccination Yearly flu shot  Dyllon was seen today for follow-up and annual exam.  Diagnoses and all orders for this visit:  Encounter for health maintenance examination in adult -     Comprehensive metabolic panel; Future -     CBC; Future -     Lipid panel; Future -     Hemoglobin A1c; Future -     Urinalysis Dipstick  Elevated blood-pressure reading without diagnosis of hypertension  Vaccine counseling  Back pain, unspecified back location, unspecified back pain laterality, unspecified chronicity

## 2017-04-21 NOTE — Telephone Encounter (Signed)
Called and l/m for pt call us back.  

## 2017-04-21 NOTE — Telephone Encounter (Signed)
Pt was given info to massage therapy

## 2017-04-23 ENCOUNTER — Other Ambulatory Visit: Payer: 59

## 2017-04-26 ENCOUNTER — Other Ambulatory Visit: Payer: 59

## 2017-04-26 DIAGNOSIS — Z0000 Encounter for general adult medical examination without abnormal findings: Secondary | ICD-10-CM

## 2017-04-26 LAB — COMPREHENSIVE METABOLIC PANEL
ALBUMIN: 4.4 g/dL (ref 3.6–5.1)
ALT: 21 U/L (ref 9–46)
AST: 22 U/L (ref 10–40)
Alkaline Phosphatase: 75 U/L (ref 40–115)
BUN: 11 mg/dL (ref 7–25)
CALCIUM: 9.5 mg/dL (ref 8.6–10.3)
CHLORIDE: 102 mmol/L (ref 98–110)
CO2: 28 mmol/L (ref 20–31)
Creat: 0.96 mg/dL (ref 0.60–1.35)
Glucose, Bld: 93 mg/dL (ref 65–99)
Potassium: 4.6 mmol/L (ref 3.5–5.3)
SODIUM: 139 mmol/L (ref 135–146)
TOTAL PROTEIN: 7.6 g/dL (ref 6.1–8.1)
Total Bilirubin: 0.7 mg/dL (ref 0.2–1.2)

## 2017-04-26 LAB — LIPID PANEL
CHOL/HDL RATIO: 6.1 ratio — AB (ref <5.0)
Cholesterol: 209 mg/dL — ABNORMAL HIGH (ref <200)
HDL: 34 mg/dL — AB (ref >40)
LDL Cholesterol: 164 mg/dL — ABNORMAL HIGH (ref <100)
Triglycerides: 54 mg/dL (ref <150)
VLDL: 11 mg/dL (ref <30)

## 2017-04-26 LAB — CBC
HCT: 44.9 % (ref 38.5–50.0)
HEMOGLOBIN: 15.1 g/dL (ref 13.2–17.1)
MCH: 28.9 pg (ref 27.0–33.0)
MCHC: 33.6 g/dL (ref 32.0–36.0)
MCV: 86 fL (ref 80.0–100.0)
MPV: 9.1 fL (ref 7.5–12.5)
PLATELETS: 354 10*3/uL (ref 140–400)
RBC: 5.22 MIL/uL (ref 4.20–5.80)
RDW: 13.5 % (ref 11.0–15.0)
WBC: 8 10*3/uL (ref 4.0–10.5)

## 2017-04-27 LAB — HEMOGLOBIN A1C
HEMOGLOBIN A1C: 5.4 % (ref <5.7)
MEAN PLASMA GLUCOSE: 108 mg/dL

## 2017-04-28 ENCOUNTER — Other Ambulatory Visit: Payer: Self-pay | Admitting: Medical

## 2017-04-28 MED ORDER — PRAVASTATIN SODIUM 20 MG PO TABS: 20.0000 mg | ORAL_TABLET | Freq: Every day | ORAL | 1 refills | Status: DC

## 2017-07-17 ENCOUNTER — Emergency Department (HOSPITAL_COMMUNITY)
Admission: EM | Admit: 2017-07-17 | Discharge: 2017-07-17 | Disposition: A | Discharge status: Home / Self Care | Payer: 59 | Attending: Emergency Medicine | Admitting: Emergency Medicine

## 2017-07-17 ENCOUNTER — Encounter (HOSPITAL_COMMUNITY): Payer: Self-pay

## 2017-07-17 DIAGNOSIS — R2232 Localized swelling, mass and lump, left upper limb: Secondary | ICD-10-CM | POA: Diagnosis not present

## 2017-07-17 DIAGNOSIS — L02414 Cutaneous abscess of left upper limb: Secondary | ICD-10-CM | POA: Diagnosis not present

## 2017-07-17 MED ORDER — SULFAMETHOXAZOLE-TRIMETHOPRIM 800-160 MG PO TABS: 1.0000 | ORAL_TABLET | Freq: Two times a day (BID) | ORAL | 0 refills | Status: AC

## 2017-07-17 MED ORDER — NAPROXEN 500 MG PO TABS: 500.0000 mg | ORAL_TABLET | Freq: Two times a day (BID) | ORAL | 0 refills | Status: DC

## 2017-07-17 MED ORDER — SULFAMETHOXAZOLE-TRIMETHOPRIM 800-160 MG PO TABS
1.0000 | ORAL_TABLET | Freq: Once | ORAL | Status: AC
  Administered 2017-07-17: 1 via ORAL
  Filled 2017-07-17: qty 1

## 2017-07-17 MED ORDER — LIDOCAINE HCL 1 % IJ SOLN
INTRAMUSCULAR | Status: AC
  Filled 2017-07-17: qty 20

## 2017-07-17 NOTE — ED Provider Notes (Signed)
[facility]-EMERGENCY DEPT Provider Note   CSN: [id number] Arrival date & time: 07/17/17  [id number]     History   Chief Complaint Chief Complaint  Patient presents with  . Abscess    Left Elbow    HPI Terrence Santiago is a 36 y.o. male.  36 year old male with no significant past medical history presents to the emergency department for evaluation of redness and swelling to his left elbow. Symptoms began 4 days ago and have been gradually worsening until today. Patient reports applying warm compresses to the area. He notes onset of spontaneous drainage this evening. Initially, patient reports decreased range of motion of his left elbow. He states that range of motion has improved since drainage. No associated fevers, numbness, tingling. Patient denies trauma or injury to his left elbow   The history is provided by the patient. No language interpreter was used.  Abscess    History reviewed. No pertinent past medical history.  Patient Active Problem List   Diagnosis Date Noted  . Encounter for health maintenance examination in adult 04/21/2017  . Elevated blood-pressure reading without diagnosis of hypertension 04/21/2017  . Vaccine counseling 04/21/2017  . Back pain 04/21/2017    Past Surgical History:  Procedure Laterality Date  . LACERATION REPAIR     left hand      Home Medications    Prior to Admission medications   Medication Sig Start Date End Date Taking? Authorizing Provider  Multiple Vitamin (MULTIVITAMIN) tablet Take 1 tablet by mouth daily.    [provider]  naproxen (NAPROSYN) 500 MG tablet Take 1 tablet (500 mg total) by mouth 2 (two) times daily. Take for pain, as needed 07/17/17   Antony MaduraHumes, Ezrie Bunyan, PA-C  pravastatin (PRAVACHOL) 20 MG tablet Take 1 tablet (20 mg total) by mouth at bedtime. 04/28/17   Tysinger, Kermit Baloavid S, PA-C  sulfamethoxazole-trimethoprim (BACTRIM DS,SEPTRA DS) 800-160 MG tablet Take 1 tablet by mouth 2 (two) times daily. 07/17/17 07/24/17  Antony MaduraHumes,  Grady Mohabir, PA-C    Family History Family History  Problem Relation Age of Onset  . Hypertension Father   . Heart disease Father        MI  . Cancer Maternal Uncle   . Diabetes Maternal Grandmother   . Stroke Neg Hx     Social History Social History  Substance Use Topics  . Smoking status: Never Smoker  . Smokeless tobacco: Never Used  . Alcohol use 0.0 oz/week     Comment: rare     Allergies   Patient has no known allergies.   Review of Systems Review of Systems Ten systems reviewed and are negative for acute change, except as noted in the HPI.    Physical Exam Updated Vital Signs BP (!) 146/88 (BP Location: Right Arm)   Pulse 77   Temp 98 F (36.7 C) (Oral)   Resp 16   SpO2 100%   Physical Exam  Constitutional: He is oriented to person, place, and time. He appears well-developed and well-nourished. No distress.  Nontoxic and in NAD  HENT:  Head: Normocephalic and atraumatic.  Eyes: Conjunctivae and EOM are normal. No scleral icterus.  Neck: Normal range of motion.  Cardiovascular: Normal rate, regular rhythm and intact distal pulses.   Distal radial pulse 2+ in the LUE  Pulmonary/Chest: Effort normal. No respiratory distress.  Respirations even and unlabored.  Musculoskeletal: Normal range of motion.  Normal ROM of the LUE and left elbow. Compartments of the LUE soft. See also skin exam.  Neurological:  He is alert and oriented to person, place, and time. He exhibits normal muscle tone. Coordination normal.  Sensation to light touch intact in the LUE  Skin: Skin is warm and dry. No rash noted. He is not diaphoretic. There is erythema. No pallor.  Abscess over the olecranon bursa of the L elbow, actively draining purulent fluid. Mild surrounding erythema with minimal induration and heat to touch. No red linear streaking.  Psychiatric: He has a normal mood and affect. His behavior is normal.  Nursing note and vitals reviewed.    ED Treatments / Results   Labs (all labs ordered are listed, but only abnormal results are displayed) Labs Reviewed - No data to display  EKG  EKG Interpretation None       Radiology No results found.  Procedures Procedures (including critical care time)  Medications Ordered in ED Medications  lidocaine (XYLOCAINE) 1 % (with pres) injection (  Not Given 07/17/17 0547)  sulfamethoxazole-trimethoprim (BACTRIM DS,SEPTRA DS) 800-160 MG per tablet 1 tablet (1 tablet Oral Given 07/17/17 0546)     Initial Impression / Assessment and Plan / ED Course  I have reviewed the triage vital signs and the nursing notes.  Pertinent labs & imaging results that were available during my care of the patient were reviewed by me and considered in my medical decision making (see chart for details).     Patient with skin abscess amenable to incision and drainage. Abscess began spontaneously draining PTA. Additional I&D recommended to prevent premature closure; patient declines. Will place on Bactrim for MRSA coverage, especially with proximity to joint. No evidence of septic joint on exam. Encouraged home warm soaks and flushing. Mild signs of cellulitis is surrounding skin. Will d/c to home with PCP follow up. Return precautions discussed and provided. Patient discharged in stable condition with no unaddressed concerns.   Final Clinical Impressions(s) / ED Diagnoses   Final diagnoses:  Abscess of left upper extremity    New Prescriptions New Prescriptions   NAPROXEN (NAPROSYN) 500 MG TABLET    Take 1 tablet (500 mg total) by mouth 2 (two) times daily. Take for pain, as needed   SULFAMETHOXAZOLE-TRIMETHOPRIM (BACTRIM DS,SEPTRA DS) 800-160 MG TABLET    Take 1 tablet by mouth 2 (two) times daily.     Antony MaduraHumes, Kharon Hixon, PA-C 07/17/17 0556    Azalia Bilisampos, Kevin, MD 07/17/17 [phone]

## 2017-07-17 NOTE — Discharge Instructions (Signed)
Take your antibiotic as prescribed until finished. Take Naproxen for pain. Continue with warm soaks or compresses 3-4 times per day to promote drainage. Return for worsening symptoms such as limited movement of your left elbow, worsening pain or redness, or fever over 100.110F.

## 2017-07-17 NOTE — ED Triage Notes (Signed)
Pt presents w/ left elbow abscess. Abscess appears to be draining. Pt denies fevers at home.

## 2017-07-19 ENCOUNTER — Encounter: Payer: Self-pay | Admitting: Medical

## 2017-07-19 ENCOUNTER — Ambulatory Visit (INDEPENDENT_AMBULATORY_CARE_PROVIDER_SITE_OTHER): Payer: 59 | Admitting: Medical

## 2017-07-19 VITALS — BP 114/68 | HR 86 | Temp 98.0°F | Wt 186.4 lb

## 2017-07-19 DIAGNOSIS — L0291 Cutaneous abscess, unspecified: Secondary | ICD-10-CM

## 2017-07-19 MED ORDER — MUPIROCIN 2 % EX OINT: 1.0000 "application " | TOPICAL_OINTMENT | Freq: Three times a day (TID) | CUTANEOUS | 0 refills | Status: DC

## 2017-07-19 NOTE — Patient Instructions (Signed)
Finish the Bactrim antibiotic Keep the elbow wound clean with soap and water Keep the wound covered with gauze and bandage Once the wound stops draining, then use ointment over the wound for another 1-2 weeks Begin Mupirocin ointment in the nose on a qtip 3 times daily for a week to help kill the bacteria as it can live in the nostrils Clean surfaces in the house such as countertops and knobs    MRSA Infection, Adult MRSA stands for methicillin-resistant Staphylococcus aureus. This type of infection is caused by Staphylococcus aureus bacteria that are no longer affected by the medicines used to kill them (drug resistant). Staphylococcus (staph) bacteria are normally found on the skin or in the nose of healthy people. In most cases, these bacteria do not cause infection. But if these resistant bacteria enter your body through a cut or sore, they can cause a serious infection on your skin or in other parts of your body. There is a slight chance that the staph on your skin or in your nose is MRSA. There are two types of MRSA infections:  Hospital-acquired MRSA is bacteria that you get in the hospital.  Community-acquired MRSA is bacteria that you get somewhere other than in a hospital.  What increases the risk? Hospital-acquired MRSA is more common. You could be at risk for this infection if you are in the hospital and you:  Have surgery or a procedure.  Have an IV access or a catheter tube placed in your body.  Have weak resistance to germs (weakened immune system).  Are elderly.  Are on kidney dialysis.  You could be at risk for community-acquired MRSA if you have a break in your skin and come into contact with MRSA. This may happen if you:  Play sports where there is skin-to-skin contact.  Live in a crowded setting, like a dormitory or a [employer].  Share towels, razors, or sports equipment with other people.  What are the signs or symptoms? Symptoms of  hospital-acquired MRSA depend on where MRSA has spread. Symptoms may include:  Wound infection.  Skin infection.  Rash.  Pneumonia.  Fever and chills.  Difficulty breathing.  Chest pain.  Community-acquired MRSA is most likely to start as a scratch or cut that becomes infected. Symptoms may include:  A pus-filled pimple.  A boil on your skin.  Pus draining from your skin.  A sore (abscess) under your skin or somewhere in your body.  Fever with or without chills.  How is this diagnosed? The diagnosis of MRSA is made by taking a sample from an infected area and sending it to a lab for testing. A lab technician can grow (culture) MRSA and check it under a microscope. The cultured MRSA can be tested to see which type of antibiotic medicine will work to treat it. Newer tests can identify MRSA more quickly by testing bacteria samples for MRSA genes. Your health care provider can diagnose MRSA using samples from:  Cuts or wounds in infected areas.  Nasal swabs.  Saliva or cough specimens from deep in the lungs (sputum).  Urine.  Blood.  You may also have:  Imaging studies (such as X-ray or MRI) to check if the infection has spread to the lungs, bones, or joints.  A culture and sensitivity test of blood or fluids from inside the joints.  How is this treated? Treatment depends on how severe, deep, or extensive the infection is. Very bad infections may require a hospital stay.  Some  skin infections, such as a small boil or sore (abscess), may be treated by draining pus from the site of the infection.  More extensive surgery to drain pus may be necessary for deeper or more widespread soft tissue infections.  You may then have to take antibiotic medicine given by mouth or through a vein. You may start antibiotic treatment right away or after testing can be done to see what antibiotic medicine should be used.  Follow these instructions at home:  Take your antibiotics as  directed by your health care provider. Take the medicine as prescribed until it is finished.  Avoid close contact with those around you as much as possible. Do not use towels, razors, toothbrushes, bedding, or other items that will be used by others.  Wash your hands frequently for 15 seconds with soap and water. Dry your hands with a clean or disposable towel.  When you are not able to wash your hands, use hand sanitizer that is more than 60 percent alcohol.  Wash towels, sheets, or clothes in the washing machine with detergent and hot water. Dry them in a hot dryer.  Follow your health care provider's instructions for wound care. Wash your hands before and after changing your bandages.  Always shower after exercising.  Keep all cuts and scrapes clean and covered with a bandage.  Be sure to tell all your health care providers that you have MRSA so they are aware of your infection. Contact a health care provider if:  You have a cut, scrape, pimple, or boil that becomes red, swollen, or painful or has pus in it.  You have pus draining from your skin.  You have an abscess under your skin or somewhere in your body. Get help right away if:  You have symptoms of a skin infection with a fever or chills.  You have trouble breathing.  You have chest pain.  You have a skin wound and you become nauseous or start vomiting. This information is not intended to replace advice given to you by your health care provider. Make sure you discuss any questions you have with your health care provider. Document Released: 12/07/2005 Document Revised: 05/14/2016 Document Reviewed: 09/29/2013 Elsevier Interactive Patient Education  2017 [employer]

## 2017-07-19 NOTE — Progress Notes (Signed)
Subjective: Chief Complaint  Patient presents with  . knot on his left arm    knot on his arm x 1 week, started draining on last friday    Here for knot on left elbow.   started a week ago with knot, swelling, redness, warmth.   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  No prior hx/o abscess.  Went to the ED Saturday morning 2 days ago for this.  They were going to I&D but at the time it was draining pus on its own.   They prescribed Bactrim which he is taking.  He says its much improved.  Pus has mostly drained out.  No culture was done at hospital.  No other aggravating or relieving factors. No other complaint.  Objective: BP 114/68   Pulse 86   Temp 98 F (36.7 C)   Wt 186 lb 6.4 oz (84.6 kg)   SpO2 97%   BMI 27.53 kg/m   Gen: wd, wn, nad Skin: left elbow over olecranon bursa region with slight raised wound that is draining slight amount of blood, otherwise no other skin lesions noted   Assessment: Encounter Diagnosis  Name Primary?  . Cutaneous abscess, unspecified site Yes     Plan: Discussed symptoms, findings, and he is improving nicely on bactrim antibiotic.  I reviewed the ED visit notes.  suspected MRSA skin abscess.  Finish the Bactrim antibiotic Keep the elbow wound clean with soap and water Keep the wound covered with gauze and bandage Once the wound stops draining, then use ointment over the wound for another 1-2 weeks Begin Mupirocin ointment in the nose on a qtip 3 times daily for a week to help kill the bacteria as it can live in the nostrils Clean surfaces in the house such as countertops and knobs   Terrence Santiago was seen today for knot on his left arm.  Diagnoses and all orders for this visit:  Cutaneous abscess, unspecified site  Other orders -     mupirocin ointment (BACTROBAN) 2 %; Place 1 application into the nose 3 (three) times daily.

## 2017-07-21 ENCOUNTER — Ambulatory Visit: Payer: Self-pay | Admitting: Medical

## 2017-07-21 ENCOUNTER — Telehealth: Payer: Self-pay

## 2017-07-21 NOTE — Telephone Encounter (Signed)

## 2017-07-21 NOTE — Telephone Encounter (Signed)
Forwarding

## 2017-07-21 NOTE — Telephone Encounter (Signed)
D, please call 

## 2017-07-21 NOTE — Telephone Encounter (Signed)
Forwarding to kathy  

## 2017-07-23 ENCOUNTER — Encounter: Payer: Self-pay | Admitting: Medical

## 2017-07-23 NOTE — Telephone Encounter (Signed)
No show letter sent.

## 2017-08-02 ENCOUNTER — Encounter: Payer: Self-pay | Admitting: Medical

## 2017-08-02 ENCOUNTER — Ambulatory Visit (INDEPENDENT_AMBULATORY_CARE_PROVIDER_SITE_OTHER): Payer: 59 | Admitting: Medical

## 2017-08-02 VITALS — BP 124/84 | HR 72 | Resp 12 | Ht 69.0 in | Wt 186.0 lb

## 2017-08-02 DIAGNOSIS — E785 Hyperlipidemia, unspecified: Secondary | ICD-10-CM | POA: Insufficient documentation

## 2017-08-02 DIAGNOSIS — L02419 Cutaneous abscess of limb, unspecified: Secondary | ICD-10-CM | POA: Diagnosis not present

## 2017-08-02 DIAGNOSIS — R748 Abnormal levels of other serum enzymes: Secondary | ICD-10-CM | POA: Diagnosis not present

## 2017-08-02 LAB — LIPID PANEL
Cholesterol: 207 mg/dL — ABNORMAL HIGH (ref <200)
HDL: 36 mg/dL — AB (ref >40)
LDL CALC: 158 mg/dL — AB (ref <100)
Total CHOL/HDL Ratio: 5.8 Ratio — ABNORMAL HIGH (ref <5.0)
Triglycerides: 65 mg/dL (ref <150)
VLDL: 13 mg/dL (ref <30)

## 2017-08-02 LAB — ALT: ALT: 27 U/L (ref 9–46)

## 2017-08-02 NOTE — Progress Notes (Signed)
Subjective: Chief Complaint  Patient presents with  . Follow-up    on meds and fasting labs   Last visit we started pravastatin due to lipids and family history of MI in father in 940s.   He is otherwise healthy, nonsmoker, active, does tae kwon do.  He is tolerating pravastatin, and has missed a few doses.  He saw me recently for abscess of skin which is mostly healed.   He used the topical mupirocin in the nose.    History reviewed. No pertinent past medical history.  Current Outpatient Prescriptions on File Prior to Visit  Medication Sig Dispense Refill  . Multiple Vitamin (MULTIVITAMIN) tablet Take 1 tablet by mouth daily.    . pravastatin (PRAVACHOL) 20 MG tablet Take 1 tablet (20 mg total) by mouth at bedtime. 90 tablet 1  . mupirocin ointment (BACTROBAN) 2 % Place 1 application into the nose 3 (three) times daily. (Patient not taking: Reported on 08/02/2017) 22 g 0   No current facility-administered medications on file prior to visit.    ROS as in subjective   Objective: BP 124/84   Pulse 72   Resp 12   Ht 5\' 9"  (1.753 m)   Wt 186 lb (84.4 kg)   SpO2 96%   BMI 27.47 kg/m   Gen: wd, wn, nad Skin: left elbow region with 1.5 cm round pink healing wound, no drainage.     Assessment: Encounter Diagnoses  Name Primary?  . Low serum HDL Yes  . Dyslipidemia, goal LDL below 130   . Abscess of arm     Plan: discussed his lipids results from last visit.  Labs today.  Counseled on diet, exercise, primary prevention of heart disease.  In 3 mo if he is eating much better and getting regular exercise, we can make decision to use diet and exercise instead of keeping the statin on board.  Depends on his efforts.   We will call with lab results since starting pravastatin from 04/2017.   abscess of arm - mostly healed.   Advised he take part of the day to keep skin uncovered now to let it finish healing.    Keep cleaning with soap and water.   When covered use the mupirocin ointment.     Terrence Santiago was seen today for follow-up.  Diagnoses and all orders for this visit:  Low serum HDL -     Lipid panel -     ALT  Dyslipidemia, goal LDL below 130 -     Lipid panel -     ALT  Abscess of arm

## 2017-09-20 ENCOUNTER — Ambulatory Visit (INDEPENDENT_AMBULATORY_CARE_PROVIDER_SITE_OTHER): Payer: 59 | Admitting: Medical

## 2017-09-20 ENCOUNTER — Encounter: Payer: Self-pay | Admitting: Medical

## 2017-09-20 VITALS — BP 136/78 | HR 84 | Temp 98.6°F | Wt 190.6 lb

## 2017-09-20 DIAGNOSIS — H5711 Ocular pain, right eye: Secondary | ICD-10-CM | POA: Diagnosis not present

## 2017-09-20 DIAGNOSIS — H5789 Other specified disorders of eye and adnexa: Secondary | ICD-10-CM | POA: Diagnosis not present

## 2017-09-20 MED ORDER — POLYMYXIN B-TRIMETHOPRIM 10000-0.1 UNIT/ML-% OP SOLN: 1.0000 [drp] | Freq: Four times a day (QID) | OPHTHALMIC | 0 refills | Status: DC

## 2017-09-20 NOTE — Progress Notes (Signed)
Subjective: Chief Complaint  Patient presents with  . right eye redness    right eye redness and some pain  x 1 week    Here for eye pain.  Had a day or 2 of right eye irritation last week, then on Thursday 5 days ago felt an eyelash in the right eye.  He was able to remove the eyelid.  Felt pain getting the eyelash out.  Has pain and redness over the next few days.  By Sunday the pain was mostly gone.   Now still has some redness and discomfort.  Using some Visine.  No itching.   Right eye still feels fuzzy compared to left.   No crusting, no pus. No sick contacts with pink eye.   No vision loss.  No other foreign body.   No other URI symptoms.  Does not wear contacts or glasses.   No other aggravating or relieving factors. No other complaint.   Past Medical History:  Diagnosis Date  . Hyperlipidemia    Current Outpatient Prescriptions on File Prior to Visit  Medication Sig Dispense Refill  . Multiple Vitamin (MULTIVITAMIN) tablet Take 1 tablet by mouth daily.    . pravastatin (PRAVACHOL) 20 MG tablet Take 1 tablet (20 mg total) by mouth at bedtime. 90 tablet 1   No current facility-administered medications on file prior to visit.    ROS as in subjective  Objective: BP 136/78   Pulse 84   Temp 98.6 F (37 C)   Wt 190 lb 9.6 oz (86.5 kg)   SpO2 96%   BMI 28.15 kg/m   Gen: wd, wn, nad Right eye with mild erythema of conjunctiva and sclera, PERRLA, EOMi, otherwise no pus , no crusting Rest of HENT unremarkable Close vision 20/20 each eye uncorrected    Assessment: Encounter Diagnoses  Name Primary?  . Eye redness Yes  . Eye pain, right      Plan: Discussed symptoms and concerns.  He likely had a corneal abrasion that has healed.  There is still erythema and possibility of conjunctivitis.    dicussed hand washing, hygiene, and begin drops below, can use cool moist compresses.    If not resolved within 4-5 days, then call back.  Return otherwise prn  Franciso was seen  today for right eye redness.  Diagnoses and all orders for this visit:  Eye redness  Eye pain, right  Other orders -     trimethoprim-polymyxin b (POLYTRIM) ophthalmic solution; Place 1 drop into the right eye every 6 (six) hours.

## 2017-11-24 ENCOUNTER — Other Ambulatory Visit: Payer: Self-pay | Admitting: Medical

## 2018-01-14 ENCOUNTER — Ambulatory Visit (HOSPITAL_COMMUNITY)
Admission: EM | Admit: 2018-01-14 | Discharge: 2018-01-14 | Disposition: A | Discharge status: Home / Self Care | Payer: 59 | Attending: Family Medicine | Admitting: Family Medicine

## 2018-01-14 ENCOUNTER — Other Ambulatory Visit: Payer: Self-pay

## 2018-01-14 ENCOUNTER — Encounter (HOSPITAL_COMMUNITY): Payer: Self-pay | Admitting: Emergency Medicine

## 2018-01-14 DIAGNOSIS — H209 Unspecified iridocyclitis: Secondary | ICD-10-CM | POA: Diagnosis not present

## 2018-01-14 MED ORDER — PREDNISOLONE ACETATE 1 % OP SUSP: 1.0000 [drp] | Freq: Three times a day (TID) | OPHTHALMIC | 0 refills | Status: DC

## 2018-01-14 MED ORDER — TOBRAMYCIN 0.3 % OP SOLN: 1.0000 [drp] | Freq: Three times a day (TID) | OPHTHALMIC | 0 refills | Status: DC

## 2018-01-14 NOTE — ED Provider Notes (Signed)
[facility]   [id number] 01/14/18 Arrival Time: 1928   SUBJECTIVE:  Bernita RaisinKenneth Defeo is a 37 y.o. male who presents to the urgent care with complaint of R eye irritation x2, denies injury. No drainage.   No joint pain or mouth sores.  No use of contacts  Patient works as a Public relations account executivecorrectional officer  Past Medical History:  Diagnosis Date  . Hyperlipidemia    Family History  Problem Relation Age of Onset  . Hypertension Father   . Heart disease Father [id number]       MI  . Cancer Maternal Uncle   . Diabetes Maternal Grandmother   . Stroke Neg Hx    Social History   Socioeconomic History  . Marital status: Single    Spouse name: Not on file  . Number of children: Not on file  . Years of education: Not on file  . Highest education level: Not on file  Social Needs  . Financial resource strain: Not on file  . Food insecurity - worry: Not on file  . Food insecurity - inability: Not on file  . Transportation needs - medical: Not on file  . Transportation needs - non-medical: Not on file  Occupational History  . Occupation: Chiropractorjailer    Employer: [employer]  Tobacco Use  . Smoking status: Never Smoker  . Smokeless tobacco: Never Used  Substance and Sexual Activity  . Alcohol use: Yes    Alcohol/week: 0.0 oz    Comment: rare  . Drug use: No  . Sexual activity: Not on file  Other Topics Concern  . Not on file  Social History Narrative   Lives with wife and 2 children and mother in [place], works as  Glass blower/designerJailer.  Exercise - circuit training, marital arts, tae kwon do.  04/2017.     No outpatient medications have been marked as taking for the 01/14/18 encounter [facility](Hospital Encounter).   No Known Allergies    ROS: As per HPI, remainder of ROS negative.   OBJECTIVE:   Vitals:   01/14/18 1938  BP: (!) 152/72  Pulse: 77  Resp: 18  Temp: 98.4 F (36.9 C)  SpO2: 99%     General appearance: alert; no distress Eyes: PERRL; EOMI; conjunctiva red around iris on right  only HENT: normocephalic; atraumatic;  oral mucosa normal Neck: supple Back: no CVA tenderness Extremities: no cyanosis or edema; symmetrical with no gross deformities Skin: warm and dry Neurologic: normal gait; grossly normal Psychological: alert and cooperative; normal mood and affect      Labs:  Results for orders placed or performed in visit on 08/02/17  Lipid panel  Result Value Ref Range   Cholesterol 207 (H) <200 mg/dL   Triglycerides 65 <119<150 mg/dL   HDL 36 (L) >14>40 mg/dL   Total CHOL/HDL Ratio 5.8 (H) <5.0 Ratio   VLDL 13 <30 mg/dL   LDL Cholesterol 782158 (H) <100 mg/dL  ALT  Result Value Ref Range   ALT 27 9 - 46 U/L    Labs Reviewed - No data to display  No results found.     ASSESSMENT & PLAN:  1. Iritis of right eye     Meds ordered this encounter  Medications  . prednisoLONE acetate (PRED FORTE) 1 % ophthalmic suspension    Sig: Place 1 drop into the right eye 3 (three) times daily.    Dispense:  5 mL    Refill:  0  . tobramycin (TOBREX) 0.3 % ophthalmic solution  Sig: Place 1 drop into the right eye 3 (three) times daily.    Dispense:  5 mL    Refill:  0    Reviewed expectations re: course of current medical issues. Questions answered. Outlined signs and symptoms indicating need for more acute intervention. Patient verbalized understanding. After Visit Summary given.    Procedures: Your eye should feel and look normal by Monday or Tuesday.  Otherwise, see your eye doctor or return     Elvina Sidle, MD 01/14/18 2012

## 2018-01-14 NOTE — Discharge Instructions (Signed)
Your eye should feel and look normal by Monday or Tuesday.  Otherwise, see your eye doctor or return

## 2018-01-14 NOTE — ED Triage Notes (Signed)
Pt c/o R eye irritation x2, denies injury. No drainage.

## 2018-01-17 DIAGNOSIS — H40013 Open angle with borderline findings, low risk, bilateral: Secondary | ICD-10-CM | POA: Diagnosis not present

## 2018-01-19 DIAGNOSIS — H20011 Primary iridocyclitis, right eye: Secondary | ICD-10-CM | POA: Diagnosis not present

## 2018-01-24 DIAGNOSIS — H20011 Primary iridocyclitis, right eye: Secondary | ICD-10-CM | POA: Diagnosis not present

## 2018-01-26 ENCOUNTER — Telehealth: Payer: Self-pay | Admitting: Medical

## 2018-01-26 NOTE — Telephone Encounter (Signed)
I received lab order from eye doctor for patient.   See separate form.   Mr. Terrence Santiago can come in at his convenience for the panel of labs.    [facility]

## 2018-01-26 NOTE — Telephone Encounter (Signed)
Called pt at 1:54 and this morning at 9:07 and left pt a note to call our office to explain that Terrence Santiago has approved for pt to have labs done for his eye doctor however pt will need to go to [facility] at [address], [place] to [place] labs drawn since our office do not have all the tubes needed for the ordered tests. Lab orders have been sent to that location

## 2018-01-27 NOTE — Telephone Encounter (Signed)
Called pt and left message again

## 2018-01-31 DIAGNOSIS — H20011 Primary iridocyclitis, right eye: Secondary | ICD-10-CM | POA: Diagnosis not present

## 2018-02-25 ENCOUNTER — Ambulatory Visit: Payer: 59 | Admitting: Family Medicine

## 2018-02-25 ENCOUNTER — Encounter: Payer: Self-pay | Admitting: Family Medicine

## 2018-02-25 VITALS — BP 132/70 | HR 81 | Temp 99.2°F | Wt 189.2 lb

## 2018-02-25 DIAGNOSIS — M545 Low back pain, unspecified: Secondary | ICD-10-CM

## 2018-02-25 LAB — POCT URINALYSIS DIP (PROADVANTAGE DEVICE)
BILIRUBIN UA: NEGATIVE
BILIRUBIN UA: NEGATIVE mg/dL
Blood, UA: NEGATIVE
Glucose, UA: NEGATIVE mg/dL
LEUKOCYTES UA: NEGATIVE
Nitrite, UA: NEGATIVE
Protein Ur, POC: NEGATIVE mg/dL
Specific Gravity, Urine: 1.015
Urobilinogen, Ur: NEGATIVE
pH, UA: 6.5 (ref 5.0–8.0)

## 2018-02-25 NOTE — Patient Instructions (Signed)
Take 2 Aleve twice daily with food. Use ice or heat to the area 2-3 times per day.   Do gentle stretches.  Give this a week and let me know if you are not improving or if your symptoms get worse.

## 2018-02-25 NOTE — Progress Notes (Signed)
   Subjective:    Patient ID: Terrence Santiago, male    DOB: [birth year], 37 y.o.   MRN: [id number]  HPI Chief Complaint  Patient presents with  . lower back pain    lower back pain  started about couple weeks ago ,  started back working out    He is here with complaints of a 5 day history right low back pain that is dull and nonradiating.  Pain started a day or 2 after he started exercising again.  Pain is worse with movements and sitting down for a while.  Rest improves pain.  States he got a massage this week and this also helped his pain. States he has a history of back pain with sciatica and this does not feel like that.  No saddle anesthesia.  No loss of control of bowels or bladder.  Denies urinary symptoms.  No testicular pain.  Denies any urinary symptoms.  Denies fever chills abdominal pain nausea vomiting or diarrhea.  Reviewed allergies, medications, past medical, surgical, family, and social history.    Review of Systems Pertinent positives and negatives in the history of present illness.     Objective:   Physical Exam  Constitutional: He is oriented to person, place, and time. He appears well-developed and well-nourished. No distress.  HENT:  Mouth/Throat: Oropharynx is clear and moist.  Eyes: Conjunctivae are normal. Pupils are equal, round, and reactive to light.  Neck: Normal range of motion. Neck supple.  Cardiovascular: Normal rate, regular rhythm, normal heart sounds and intact distal pulses.  Pulmonary/Chest: Effort normal and breath sounds normal.  Abdominal: Soft. Bowel sounds are normal. He exhibits no distension. There is no tenderness. There is no rebound, no guarding and no CVA tenderness.  Musculoskeletal:       Cervical back: Normal.       Thoracic back: Normal.       Lumbar back: He exhibits tenderness and pain. He exhibits no bony tenderness.       Back:  Right lumbar paraspinal muscle tenderness.  Negative straight leg raise.  Pain with extreme  flexion, extension and twisting.   Neurological: He is alert and oriented to person, place, and time. He has normal reflexes. No cranial nerve deficit. Coordination normal.  Skin: Skin is warm and dry. No rash noted. No erythema. No pallor.   BP 132/70   Pulse 81   Temp 99.2 F (37.3 C)   Wt 189 lb 3.2 oz (85.8 kg)   SpO2 97%   BMI 27.94 kg/m       Assessment & Plan:  Acute right-sided low back pain without sciatica - Plan: POCT Urinalysis DIP (Proadvantage Device) Urinalysis dipstick negative Discussed that he appears to have a muscle strain and I recommend conservative treatment at this time.  No neurological deficits, no spasm.  He will try 2 Aleve twice daily with food, ice or heat and gentle stretches. He will call if his symptoms are not improving after a week or so.  He will notify me if he has any new or worsening symptoms.

## 2018-04-13 ENCOUNTER — Encounter: Payer: Self-pay | Admitting: Family Medicine

## 2018-04-13 ENCOUNTER — Ambulatory Visit: Payer: 59 | Admitting: Family Medicine

## 2018-04-13 ENCOUNTER — Ambulatory Visit
Admission: RE | Admit: 2018-04-13 | Discharge: 2018-04-13 | Disposition: A | Discharge status: Home / Self Care | Payer: 59 | Source: Ambulatory Visit | Attending: Family Medicine | Admitting: Family Medicine

## 2018-04-13 VITALS — BP 120/80 | HR 63 | Temp 98.0°F | Ht 69.5 in | Wt 192.6 lb

## 2018-04-13 DIAGNOSIS — M545 Low back pain, unspecified: Secondary | ICD-10-CM

## 2018-04-13 DIAGNOSIS — G8929 Other chronic pain: Secondary | ICD-10-CM

## 2018-04-13 NOTE — Progress Notes (Signed)
   Subjective:    Patient ID: Terrence Santiago, male    DOB: [birth year], 37 y.o.   MRN: [id number]  HPI Chief Complaint  Patient presents with  . back pain    on and off back pain, yesterday pain- back and to the side   He is here with complaints of intermittent low back pain for the past several weeks. He reports having low back pain with sciatica in the past but states this pain is different. Reports a dull ache that occurs right or left and this sometimes depends on which side he wears his work belt. He works in a jail.and wears a heavy work belt.   I saw him over a month ago with the same complaints.   Denies having pain today. Pain is non radiating when present. Nothing makes it worse. Rest, Aleve and massage help with pain.   No saddle anesthesia.  No loss of control of bowels or bladder.  Denies urinary symptoms.  No testicular pain.  Denies any urinary symptoms.  Denies fever chills abdominal pain nausea vomiting or diarrhea.  Reviewed allergies, medications, past medical, surgical, family, and social history.   Review of Systems Pertinent positives and negatives in the history of present illness.     Objective:   Physical Exam  Constitutional: He is oriented to person, place, and time. He appears well-developed and well-nourished. No distress.  Neck: Normal range of motion. Neck supple.  Musculoskeletal:       Right hip: Normal.       Left hip: Normal.       Thoracic back: Normal.       Lumbar back: Normal.  Tight hip flexors   Negative straight leg raise   Neurological: He is alert and oriented to person, place, and time. He has normal strength and normal reflexes. No cranial nerve deficit or sensory deficit. Gait normal.  Skin: Skin is warm and dry. No rash noted.   BP 120/80   Pulse 63   Temp 98 F (36.7 C) (Oral)   Ht 5' 9.5" (1.765 m)   Wt 192 lb 9.6 oz (87.4 kg)   BMI 28.03 kg/m       Assessment & Plan:  Chronic bilateral low back pain without  sciatica - Plan: DG Lumbar Spine Complete, Ambulatory referral to Physical Therapy  He is asymptomatic today but does have the intermittent low back pain several days per week. Symptomatic treatment is helping temporarily.  Lumbar x-ray ordered.  Referral to PT.  He will continue with symptomatic treatment using ice or heat, taking Aleve and gentle stretches for now.  He is also using a topical analgesic and getting massage when needed.

## 2018-04-13 NOTE — Patient Instructions (Signed)
Let's see what your XR shows and have you start physical therapy.   Continue treating your symptoms with Aleve as needed, heat or ice and topical medication.

## 2018-04-27 DIAGNOSIS — M545 Low back pain: Secondary | ICD-10-CM | POA: Diagnosis not present

## 2018-05-02 DIAGNOSIS — M545 Low back pain: Secondary | ICD-10-CM | POA: Diagnosis not present

## 2018-05-09 DIAGNOSIS — H20011 Primary iridocyclitis, right eye: Secondary | ICD-10-CM | POA: Diagnosis not present

## 2018-05-13 DIAGNOSIS — M545 Low back pain: Secondary | ICD-10-CM | POA: Diagnosis not present

## 2018-05-17 DIAGNOSIS — M545 Low back pain: Secondary | ICD-10-CM | POA: Diagnosis not present

## 2018-06-20 ENCOUNTER — Other Ambulatory Visit: Payer: Self-pay | Admitting: Family Medicine

## 2018-08-30 ENCOUNTER — Other Ambulatory Visit: Payer: Self-pay | Admitting: Medical

## 2018-09-22 DIAGNOSIS — H20011 Primary iridocyclitis, right eye: Secondary | ICD-10-CM | POA: Diagnosis not present

## 2018-09-29 DIAGNOSIS — H20011 Primary iridocyclitis, right eye: Secondary | ICD-10-CM | POA: Diagnosis not present

## 2018-10-31 DIAGNOSIS — M25531 Pain in right wrist: Secondary | ICD-10-CM | POA: Insufficient documentation

## 2018-10-31 DIAGNOSIS — S63649A Sprain of metacarpophalangeal joint of unspecified thumb, initial encounter: Secondary | ICD-10-CM | POA: Insufficient documentation

## 2018-11-05 ENCOUNTER — Other Ambulatory Visit: Payer: Self-pay | Admitting: Medical

## 2018-11-07 NOTE — Telephone Encounter (Signed)
Left message on voicemail for patient to call back to schedule CPE.  I will send in 1 month of pravastatin until he can get in for appointment.

## 2018-11-09 DIAGNOSIS — H20011 Primary iridocyclitis, right eye: Secondary | ICD-10-CM | POA: Diagnosis not present

## 2018-11-23 ENCOUNTER — Encounter: Payer: Self-pay | Admitting: Medical

## 2018-11-23 ENCOUNTER — Ambulatory Visit: Payer: 59 | Admitting: Medical

## 2018-11-23 VITALS — BP 130/80 | HR 74 | Temp 97.8°F | Resp 16 | Ht 69.5 in | Wt 191.4 lb

## 2018-11-23 DIAGNOSIS — Z8249 Family history of ischemic heart disease and other diseases of the circulatory system: Secondary | ICD-10-CM | POA: Insufficient documentation

## 2018-11-23 DIAGNOSIS — E785 Hyperlipidemia, unspecified: Secondary | ICD-10-CM

## 2018-11-23 DIAGNOSIS — R748 Abnormal levels of other serum enzymes: Secondary | ICD-10-CM

## 2018-11-23 MED ORDER — PRAVASTATIN SODIUM 20 MG PO TABS: ORAL_TABLET | ORAL | 0 refills | Status: DC

## 2018-11-23 NOTE — Patient Instructions (Signed)
Lets change strategies and try something that may work better than what you are currently doing  I want you to eat 3 meals a day +2 snacks, one midmorning snack and one mid afternoon snack  Breakfast You may eat 1 of the following  Smoothie with Almond milk, handful of kale or spinach, and 1-2 fruit servings of your choice such as berries or 1/2 banana  Whole grain slice of toast and thin layer of low sugar jam or small amount of honey  Whole grain slice of toast and avocado spread  1/2 cup of steel cut oats (oatmeal)   Mid-morning snack 1 fruit serving such as one of the following:  medium-sized apple  medium-sized orange,  Tangerine  1/2 banana   3/4 cup of fresh berries or frozen berries  A protein source such as one of the following:  8 almonds   small handful of walnuts or other nuts  Hummus and vegetable such as carrots   Lunch A protein source such as 1 of the following: . 1 serving of beans such as black beans, pinto beans, green beans, or edamame (soy beans) . Veggie burger  . Non breaded fish such as salmon or tuna, either baked, grilled, or broiled Vegetable - Half of your plate should be a non-starchy vegetables!  So avoid white potatoes and corn.  Otherwise, eat a large portion of vegetables. . Avocado, cucumber, tomato, carrots, greens, lettuce, squash, okra, etc.  . Vegetables can include salad with olive oil/vinaigrette dressing Grains such as 1/2 cup of brown rice, quinoa, barley or other whole grain or 1 or 2 slices of whole grain bread   Mid-afternoon snack 1 fruit serving such as one of the following:  medium-sized apple  medium-sized orange,  Tangerine  1/2 banana   3/4 cup of fresh berries or frozen berries  A protein source such as one of the following:  8 almonds   small handful of walnuts or other nuts  Hummus and vegetable such as carrots   Dinner A protein source such as 1 of the following: . 1 serving of beans such as  black beans, pinto beans, green beans, or edamame (soy beans) . Veggie burger  . Non breaded fish such as salmon or tuna, either baked, grilled, or broiled Vegetable - Half of your plate should be a non-starchy vegetables!  So avoid white potatoes and corn.  Otherwise, eat a large portion of vegetables. . Avocado, cucumber, tomato, carrots, greens, lettuce, squash, okra, etc.  . Vegetables can include salad with olive oil/vinaigrette dressing Grains such as 1/2 cup of brown rice, quinoa, barley or other whole grain or 1 or 2 slices of whole grain bread   Beverages: Water Unsweet tea Home made juice with a juicer without sugar added other than small bit of honey or agave nectar Water with sugar free flavor such as [place]   AVOID.... For the time being I want you to cut out the following items completely: . Soda, sweet tea, juice, beer or wine or alcohol . Sweets such as cake, candy, pies, chips, cookies, chocolate  
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
## 2018-12-06 ENCOUNTER — Telehealth: Payer: Self-pay | Admitting: Medical

## 2018-12-06 NOTE — Telephone Encounter (Signed)
I didn't realize that he had a large panel of labs recommended by his eye doctor that was sent over.  Please make copy for Terrence Santiago and have him return at his convenience for labs per eye doctor.

## 2018-12-28 ENCOUNTER — Ambulatory Visit: Payer: 59 | Admitting: Medical

## 2018-12-28 ENCOUNTER — Encounter: Payer: Self-pay | Admitting: Medical

## 2018-12-28 VITALS — BP 120/76 | HR 70 | Temp 98.2°F | Resp 16 | Ht 69.0 in | Wt 189.6 lb

## 2018-12-28 DIAGNOSIS — S76312A Strain of muscle, fascia and tendon of the posterior muscle group at thigh level, left thigh, initial encounter: Secondary | ICD-10-CM | POA: Diagnosis not present

## 2018-12-28 DIAGNOSIS — M5442 Lumbago with sciatica, left side: Secondary | ICD-10-CM | POA: Diagnosis not present

## 2018-12-28 MED ORDER — CYCLOBENZAPRINE HCL 10 MG PO TABS: 10.0000 mg | ORAL_TABLET | Freq: Every evening | ORAL | 0 refills | Status: DC | PRN

## 2018-12-28 MED ORDER — NAPROXEN 500 MG PO TABS: 500.0000 mg | ORAL_TABLET | Freq: Two times a day (BID) | ORAL | 0 refills | Status: DC

## 2018-12-28 NOTE — Progress Notes (Signed)
Subjective: Chief Complaint  Patient presents with  . left leg pain    left leg pain sciatic    Here for left leg pain, sciatica flare.   He has done some sprinting recently,may have caused some injury.   Been having pain in posterior left leg from buttock down to foot.   Has some tingling, but no burning, no numbness, no weakness.   Has some left low back pain.    Pain worse in sciatic region of buttock.   No fever, no problems with voiding or defecation, no blood in stool or urine.   No fall, no trauma, no other injury.   Using tylenol for pain.   No other aggravating or relieving factors. No other complaint.  Past Medical History:  Diagnosis Date  . Hyperlipidemia    Current Outpatient Medications on File Prior to Visit  Medication Sig Dispense Refill  . Multiple Vitamin (MULTIVITAMIN) tablet Take 1 tablet by mouth daily.    . pravastatin (PRAVACHOL) 20 MG tablet TAKE 1 TABLET BY MOUTH EVERYDAY AT BEDTIME 90 tablet 0   No current facility-administered medications on file prior to visit.    Past Surgical History:  Procedure Laterality Date  . LACERATION REPAIR     left hand   ROS as in subjective    Objective: BP 120/76   Pulse 70   Temp 98.2 F (36.8 C) (Oral)   Resp 16   Ht 5\' 9"  (1.753 m)   Wt 189 lb 9.6 oz (86 kg)   SpO2 98%   BMI 28.00 kg/m   Gen: wd, wn, nad Skin unremarkable Back mild tenderness over left lumbar region and left hamstring and buttock, pain with back ROM which is limited due to pain in left buttock, but no swelling, no deformity normal hip and leg ROM.    Legs neurovascularly intact   Assessment: Encounter Diagnoses  Name Primary?  . Hamstring strain, left, initial encounter Yes  . Left-sided low back pain with left-sided sciatica, unspecified chronicity      Plan: I reviewed his lumbar spine xray from 04/13/18.   current symptoms suggest biceps femoris/hamstring strain.  Advised 5-7 day course of relative rest, stretching, can use  heat, massage, medications below, and gradual return to activity as symptoms improve.   Caution with sedation on flexeril.  If not improving over the course of the week, let me know.    Wished him luck on his potential promotion at work.   Tj was seen today for left leg pain.  Diagnoses and all orders for this visit:  Hamstring strain, left, initial encounter  Left-sided low back pain with left-sided sciatica, unspecified chronicity  Other orders -     naproxen (NAPROSYN) 500 MG tablet; Take 1 tablet (500 mg total) by mouth 2 (two) times daily with a meal. -     cyclobenzaprine (FLEXERIL) 10 MG tablet; Take 1 tablet (10 mg total) by mouth at bedtime as needed.

## 2018-12-30 NOTE — Telephone Encounter (Signed)
Done

## 2018-12-30 NOTE — Telephone Encounter (Signed)
See message.

## 2019-01-11 ENCOUNTER — Telehealth: Payer: Self-pay | Admitting: Medical

## 2019-01-11 ENCOUNTER — Other Ambulatory Visit: Payer: Self-pay | Admitting: Medical

## 2019-01-11 MED ORDER — CYCLOBENZAPRINE HCL 10 MG PO TABS: 10.0000 mg | ORAL_TABLET | Freq: Every evening | ORAL | 0 refills | Status: DC | PRN

## 2019-01-11 MED ORDER — NAPROXEN 500 MG PO TABS: 500.0000 mg | ORAL_TABLET | Freq: Two times a day (BID) | ORAL | 0 refills | Status: DC

## 2019-01-11 NOTE — Telephone Encounter (Signed)
Pt called and stated that he is having issues with his leg again. He states it was feeling better and he stopped taking both meds but now pain has returned and he is out of both naproxen and flexeril. Please advise pt at [phone]. Pt states he will need refills.

## 2019-01-11 NOTE — Telephone Encounter (Signed)
I refilled medication but if not much improved in 2-3 weeks, then recheck.   May need other eval

## 2019-01-12 NOTE — Telephone Encounter (Signed)
Patient notified

## 2019-01-19 ENCOUNTER — Ambulatory Visit: Payer: 59 | Admitting: Medical

## 2019-01-19 VITALS — BP 118/84 | HR 76 | Resp 16 | Ht 69.0 in | Wt 192.4 lb

## 2019-01-19 DIAGNOSIS — H209 Unspecified iridocyclitis: Secondary | ICD-10-CM | POA: Diagnosis not present

## 2019-01-19 DIAGNOSIS — M76892 Other specified enthesopathies of left lower limb, excluding foot: Secondary | ICD-10-CM | POA: Insufficient documentation

## 2019-01-19 DIAGNOSIS — H20011 Primary iridocyclitis, right eye: Secondary | ICD-10-CM | POA: Diagnosis not present

## 2019-01-19 DIAGNOSIS — M25552 Pain in left hip: Secondary | ICD-10-CM | POA: Insufficient documentation

## 2019-01-19 NOTE — Progress Notes (Signed)
Subjective: Chief Complaint  Patient presents with  . Leg Pain    left hip area x 1 week.    Here for left leg pain for a little more than a week. Denies injury, trauma, fall.  no fever, no swelling, no knee or calve pain.  No buttock pain.  No back pain.  I just saw him 12/28/2018 hamstring strain on the left.  He has been doing a boot camp class that is pretty intense.  He had been doing sprints earlier in the month, he does weight, other resistance training and high intensity training.  He is using some Naprosyn.  No other aggravating relieving factors.  No other leg pain no other extremity pain.  Review of systems as in subjective   Objective BP 118/84   Pulse 76   Resp 16   Ht 5\' 9"  (1.753 m)   Wt 192 lb 6.4 oz (87.3 kg)   BMI 28.41 kg/m   General well-developed well-nourished no acute stress, African-American male Skin unremarkable Mild tenderness over the anterior superior iliac spine, mild tenderness lateral superior pelvis otherwise hips legs back nontender throughout No back deformity No abdominal tenderness or mass No extremity edema Legs neurovascularly intact, DTRs normal Pain with left hip flexion, pain rising from a squat position to standing position in the same anterior superior left thigh, pain with resisted left hip flexion, otherwise legs nontender without swelling or deformity    Assessment: Encounter Diagnoses  Name Primary?  . Hip flexor tendinitis, left Yes  . Hip pain, acute, left   . Anterior uveitis       Plan: Hip flexor tendinitis, hip pain- advised 8 to 10 days of Naprosyn, relative rest, gentle stretching as discussed, ice pack to the left hip anteriorly and superiorly 20 minutes 3 times daily for the next few days.  If not much improved within 8 to 10 days let me know.  Anterior uveitis -his eye doctor recently sent an order for labs to [place], and I advised that we would be able to draw his blood work but he may want to call insurance first to  inquire about deductibles before doing the large panel of specialty labs from his eye doctor.  We discussed the labs recommended

## 2019-01-19 NOTE — Patient Instructions (Signed)
Hip Flexor Strain  Recommendations:  Use ice water pack 20 minutes 2-3 times daily  Rest the legs  Avoid sudden explosive activity such as sprints  Use the Naprosyn you have  Symptoms should gradually improve over the next 8-10 days assuming you don't re-injure yourself

## 2019-04-27 DIAGNOSIS — H2011 Chronic iridocyclitis, right eye: Secondary | ICD-10-CM | POA: Diagnosis not present

## 2019-11-22 ENCOUNTER — Encounter: Payer: Self-pay | Admitting: Medical

## 2019-11-22 ENCOUNTER — Other Ambulatory Visit: Payer: Self-pay

## 2019-11-22 ENCOUNTER — Ambulatory Visit: Payer: 59 | Admitting: Medical

## 2019-11-22 VITALS — BP 126/80 | HR 80 | Temp 99.7°F | Ht 69.0 in | Wt 190.4 lb

## 2019-11-22 DIAGNOSIS — S39012A Strain of muscle, fascia and tendon of lower back, initial encounter: Secondary | ICD-10-CM | POA: Diagnosis not present

## 2019-11-22 DIAGNOSIS — M62830 Muscle spasm of back: Secondary | ICD-10-CM | POA: Diagnosis not present

## 2019-11-22 MED ORDER — NAPROXEN 500 MG PO TABS: 500.0000 mg | ORAL_TABLET | Freq: Two times a day (BID) | ORAL | 0 refills | Status: DC

## 2019-11-22 MED ORDER — CYCLOBENZAPRINE HCL 10 MG PO TABS: 10.0000 mg | ORAL_TABLET | Freq: Every evening | ORAL | 0 refills | Status: DC | PRN

## 2019-11-22 NOTE — Progress Notes (Signed)
Subjective:  Terrence Santiago is a 38 y.o. male who presents for Chief Complaint  Patient presents with  . Flank Pain    right side rsadiating to lower back      Here for about 1.5 week hx/o pain in right flank.  Thought it was pain from working out.  Saw massage therapist and chiropractor and did some treatment and that hasn't helped.  Wondering if this is not musculoskeletal.  Feels pain in right lower abdomen/flank towards the back.  Is intermittent.  If he turns or moves the wrong way he can notice it.    No burning or pain with urination, no blood in urine, no urine frequency, no urine urgency.  No constipation, no diarrhea, no blood in stool.   He does feel like he needs to drink more water.    He has been working out regularly including lifting weights.   He notes one particular exercise recently that may have been a little much for his back.     No hx/o renal stones.     No fam hx/o prostate cancer  No other aggravating or relieving factors.    No other c/o.  The following portions of the patient's history were reviewed and updated as appropriate: allergies, current medications, past family history, past medical history, past social history, past surgical history and problem list.  ROS Otherwise as in subjective above  Past Medical History:  Diagnosis Date  . Hyperlipidemia    Current Outpatient Medications on File Prior to Visit  Medication Sig Dispense Refill  . Multiple Vitamin (MULTIVITAMIN) tablet Take 1 tablet by mouth daily.    . pravastatin (PRAVACHOL) 20 MG tablet TAKE 1 TABLET BY MOUTH EVERYDAY AT BEDTIME 90 tablet 0   No current facility-administered medications on file prior to visit.     Family History  Problem Relation Age of Onset  . Hypertension Father   . Heart disease Father 44       MI  . Cancer Maternal Uncle   . Diabetes Maternal Grandmother   . Stroke Neg Hx     Objective: BP 126/80   Pulse 80   Temp 99.7 F (37.6 C)   Ht 5\' 9"  (1.753 m)    Wt 190 lb 6.4 oz (86.4 kg)   SpO2 97%   BMI 28.12 kg/m   General appearance: alert, no distress, well developed, well nourished Tender right low back somewhat lateral paraspinal region, pain when he was lying down and getting up from supine, pain when he went to rotate left and pain in same right low back when he lifted left leg.  otherwise back nontender.  +spasm right low back Abdomen: soft, non tender, non distended, no masses, no hepatomegaly, no splenomegaly Pulses: 2+ radial pulses, 2+ pedal pulses, normal cap refill Ext: no edema Legs neurovascularly intact, nontender, no deformity   Assessment: Encounter Diagnoses  Name Primary?  . Back strain, initial encounter Yes  . Muscle spasm of back      Plan: Discussed findings, diagnosis.  Advised 1-2 weeks of relative rest, no heavy lifting, can use stretching, heat, massage, and walking for aerobic activity.  Medications below for 3-5 days, then prn.  Over the next 1-2 weeks as symptoms improved and resolve, can gradually resume back to normal activity.   Discussed strength training, weight lifting and proper technique to avoid injury  Terrence Santiago was seen today for flank pain.  Diagnoses and all orders for this visit:  Back strain, initial encounter  Muscle spasm of back  Other orders -     naproxen (NAPROSYN) 500 MG tablet; Take 1 tablet (500 mg total) by mouth 2 (two) times daily with a meal. -     cyclobenzaprine (FLEXERIL) 10 MG tablet; Take 1 tablet (10 mg total) by mouth at bedtime as needed.    Follow up: prn

## 2020-01-12 ENCOUNTER — Other Ambulatory Visit: Payer: Self-pay

## 2020-01-12 ENCOUNTER — Ambulatory Visit: Payer: 59 | Admitting: Medical

## 2020-01-12 ENCOUNTER — Telehealth: Payer: Self-pay | Admitting: Medical

## 2020-01-12 VITALS — BP 122/84 | HR 88 | Temp 98.0°F | Ht 69.0 in | Wt 192.8 lb

## 2020-01-12 DIAGNOSIS — R300 Dysuria: Secondary | ICD-10-CM

## 2020-01-12 DIAGNOSIS — M545 Low back pain, unspecified: Secondary | ICD-10-CM

## 2020-01-12 LAB — POCT URINALYSIS DIP (PROADVANTAGE DEVICE)
Bilirubin, UA: NEGATIVE
Blood, UA: NEGATIVE
Glucose, UA: NEGATIVE mg/dL
Ketones, POC UA: NEGATIVE mg/dL
Leukocytes, UA: NEGATIVE
Nitrite, UA: NEGATIVE
Protein Ur, POC: NEGATIVE mg/dL
Specific Gravity, Urine: 1.02
Urobilinogen, Ur: NEGATIVE
pH, UA: 6 (ref 5.0–8.0)

## 2020-01-12 NOTE — Telephone Encounter (Signed)
Call Monday to see if back pain has improved.  Urine dipstick and microscopic was normal Friday  If gets another flank pain flare up , we will scan.    F/u soon for fasting physical and labs.

## 2020-01-12 NOTE — Progress Notes (Signed)
Subjective:  Terrence Santiago is a 39 y.o. male who presents for Chief Complaint  Patient presents with  . Back Pain    lower back radiating to left upper thigh      Here for back pain.  I saw him in December for back pain as well.  Looking back he thinks he may have had a kidney stone passed.  He notes that around January 7 he saw a few particles of stone material in the toilet bowl and he felt some irritation.  He had had pain from last visit till about that time in his low back or flank.  That pain was mostly on the right.  Today he notes a few days of midline low back pain.  His prior flank pain resolved and he has been pain-free for the last week and 1/2 to 2 weeks until the last couple days.  He denies recent injury but did work out the other day at [employer] although he had not worked out daily in recent weeks.  So he may just be sore from his workout he does note some pain in the left upper thigh not past midshaft of the thigh.  He denies any other pain down the leg, no numbness or tingling or weakness.  No abdominal pain.  No fever no nausea or vomiting.  No blood in the urine no other urinary changes.  No other aggravating or relieving factors. No other complaint.   The following portions of the patient's history were reviewed and updated as appropriate: allergies, current medications, past family history, past medical history, past social history, past surgical history and problem list.  ROS Otherwise as in subjective above    Objective: BP 122/84   Pulse 88   Temp 98 F (36.7 C)   Ht 5\' 9"  (1.753 m)   Wt 192 lb 12.8 oz (87.5 kg)   SpO2 97%   BMI 28.47 kg/m   General appearance: alert, no distress, well developed, well nourished Mild midline lumbar spine tenderness nontender, mild pain with ROM which is full.    Abdomen: soft, non tender, non distended, no masses, no hepatomegaly, no splenomegaly Legs nontender, normal hip ROM, no swelling or deformity. Pulses: 2+ radial pulses,  2+ pedal pulses, normal cap refill Ext: no edema Legs neurovascularly intact, nontender, no deformity   Assessment: Encounter Diagnoses  Name Primary?  . Low back pain, unspecified back pain laterality, unspecified chronicity, unspecified whether sciatica present Yes  . Dysuria      Plan: We discussed the symptoms currently which sound more related to mild strain symptoms versus possible radiculopathy mild versus degenerative disease in the spine.  However his pain a few weeks ago he likely could have been the passing of a renal stone.  He still has Naprosyn and Flexeril he will use at home the next several days.  We discussed stretching.  I will check his urine today for blood or other abnormality.  I advise if his pain significantly worsens in the next few days suggestive of stone or worsening radicular pain consider imaging such as CT scan abdomen pelvis or KUB  Hydrate well, advised stretching, relative rest, no heavy lifting the next few days.  Urine micro shows no blood, crystals or other abnormalities.  Terrence Santiago was seen today for back pain.  Diagnoses and all orders for this visit:  Low back pain, unspecified back pain laterality, unspecified chronicity, unspecified whether sciatica present  Dysuria   Follow up: soon for physical

## 2020-01-15 ENCOUNTER — Other Ambulatory Visit: Payer: Self-pay | Admitting: Medical

## 2020-01-15 MED ORDER — NAPROXEN 500 MG PO TABS: 500.0000 mg | ORAL_TABLET | Freq: Two times a day (BID) | ORAL | 1 refills | Status: DC

## 2020-01-15 NOTE — Telephone Encounter (Signed)
Patient stated he is feeling better but he needs a refill naproxen. Please advise

## 2020-03-25 ENCOUNTER — Encounter: Payer: Self-pay | Admitting: Family Medicine

## 2020-03-25 ENCOUNTER — Ambulatory Visit: Payer: 59 | Admitting: Family Medicine

## 2020-03-25 ENCOUNTER — Other Ambulatory Visit: Payer: Self-pay

## 2020-03-25 VITALS — BP 142/100 | HR 88 | Temp 98.6°F | Ht 69.0 in | Wt 192.8 lb

## 2020-03-25 DIAGNOSIS — K602 Anal fissure, unspecified: Secondary | ICD-10-CM

## 2020-03-25 DIAGNOSIS — R030 Elevated blood-pressure reading, without diagnosis of hypertension: Secondary | ICD-10-CM

## 2020-03-25 NOTE — Progress Notes (Signed)
Chief Complaint  Patient presents with  . Rectal Bleeding    Sat had two BM's and saw a decent amount of blood in toilet and a lot on the toilet paper (bright red in color). Had some LBP prior to seeing the blood, no rectal pain. Has not had a BM since.    He recently did a "cleanse", had more frequent and loose stools.  Finished the cleanse on 3/26.  Last week bowels were fairly normal.  Saturday he strained to pass a bowel movement, and had a dull discomfort as he passed the wide caliber stool.  Noticed blood on the toilet paper and in the toilet.  Mainly noticed with wiping. Hasn't had any further bleeding since, also hasn't had another bowel movement since then.  2 months ago he had something similar. He felt a bulging, used OTC creams for hemorrhoids, and it resolved.  He had some discomfort at the right lateral hip area.  This is why he took naproxen, which helped. No back or flank pain. Heat and rest helped.  Denies pain currently.  PMH, PSH, SH reviewed  Outpatient Encounter Medications as of 03/25/2020  Medication Sig Note  . Multiple Vitamin (MULTIVITAMIN) tablet Take 1 tablet by mouth daily.   . naproxen (NAPROSYN) 500 MG tablet Take 1 tablet (500 mg total) by mouth 2 (two) times daily with a meal. 03/25/2020: Took one dose Sat  . prednisoLONE acetate (PRED FORTE) 1 % ophthalmic suspension Place 1 drop into the right eye 4 (four) times daily.   . NON FORMULARY Take 4 capsules by mouth in the morning and at bedtime. 03/25/2020: "the Cleaner"-7 day cleanse. Stopped 03/15/20  . pravastatin (PRAVACHOL) 20 MG tablet TAKE 1 TABLET BY MOUTH EVERYDAY AT BEDTIME (Patient not taking: Reported on 01/12/2020)   . [DISCONTINUED] cyclobenzaprine (FLEXERIL) 10 MG tablet Take 1 tablet (10 mg total) by mouth at bedtime as needed.    No facility-administered encounter medications on file as of 03/25/2020.   No Known Allergies  ROS: no fever, chills, URI symptoms. He reports being nervous/anxious about the  blood in the stool. No abdominal pain, urinary complaints, nausea or vomiting.  GI complaints per HPI   PHYSICAL EXAM:  BP (!) 142/100   Pulse 88   Temp 98.6 F (37 C) (Other (Comment))   Ht 5\' 9"  (1.753 m)   Wt 192 lb 12.8 oz (87.5 kg)   BMI 28.47 kg/m   Pleasant, well-appearing male in uniform. He is somewhat anxious, but otherwise in no distress HEENT: conjunctiva and sclera are clear, EOMI. Wearing mask Abdomen: soft, nontender Rectal: no bulging or visible abnormality externally.  When skin at anus was spread, there was noted to be an area of erythema anteriorly, consistent with a healing fissure (no active bleeding). Digital rectal exam--no mass, nontender, light brown heme negative stool Normal sphincter tone Psych: slightly anxious, normal affect, hygiene, grooming, eye contact and speech Neuro: alert and oriented, normal gait, strength   ASSESSMENT/PLAN:  Anal fissure - healing, not actively bleeding. Encouraged increased water and fiber intake  Elevated blood pressure reading without diagnosis of hypertension - worry about rectal bleeding likely contributing. Encouraged to check BP elsewhere to ensure that it isn't elevated   Monitor BP elsewhere to ensure it doesn't stay elevated.

## 2020-03-25 NOTE — Patient Instructions (Signed)
Monitor BP elsewhere to ensure it doesn't stay elevated.  Drink plenty of water and eat a high fiber diet. Consider taking stool softener if stools remain hard/straining. I believe the cause of your bleeding was an anal fissure (that is no longer bleeding)   Anal Fissure, Adult  An anal fissure is a small tear or crack in the tissue of the anus. Bleeding from a fissure usually stops on its own within a few minutes. However, bleeding will often occur again with each bowel movement until the fissure heals. What are the causes? This condition is usually caused by passing a large or hard stool (feces). Other causes include:  Constipation.  Frequent diarrhea.  Inflammatory bowel disease (Crohn's disease or ulcerative colitis).  Childbirth.  Infections.  Anal sex. What are the signs or symptoms? Symptoms of this condition include:  Bleeding from the rectum.  Small amounts of blood seen on your stool, on the toilet paper, or in the toilet after a bowel movement. The blood coats the outside of the stool and is not mixed with the stool.  Painful bowel movements.  Itching or irritation around the anus. How is this diagnosed? A health care provider may diagnose this condition by closely examining the anal area. An anal fissure can usually be seen with careful inspection. In some cases, a rectal exam may be performed, or a short tube (anoscope) may be used to examine the anal canal. How is this treated? Initial treatment for this condition may include:  Taking steps to avoid constipation. This may include making changes to your diet, such as increasing your intake of fiber or fluid.  Taking fiber supplements. These supplements can soften your stool to help make bowel movements easier. Your health care provider may also prescribe a stool softener if your stool is hard.  Taking sitz baths. This may help to heal the tear.  Using medicated creams or ointments. These may be prescribed to  lessen discomfort. Treatments that are sometimes used if initial treatments do not work well or if the condition is more severe may include:  Botulinum injection.  Surgery to repair the fissure. Follow these instructions at home: Eating and drinking   Avoid foods that may cause constipation, such as bananas, milk, and other dairy products.  Eat all fruits, except bananas.  Drink enough fluid to keep your urine pale yellow.  Eat foods that are high in fiber, such as beans, whole grains, and fresh fruits and vegetables. General instructions   Take over-the-counter and prescription medicines only as told by your health care provider.  Use creams or ointments only as told by your health care provider.  Keep the anal area clean and dry.  Take sitz baths as told by your health care provider. Do not use soap in the sitz baths.  Keep all follow-up visits as told by your health care provider. This is important. Contact a health care provider if you have:  More bleeding.  A fever.  Diarrhea that is mixed with blood.  Pain that continues.  Ongoing problems that are getting worse rather than better. Summary  An anal fissure is a small tear or crack in the tissue of the anus. This condition is usually caused by passing a large or hard stool (feces). Other causes include constipation and frequent diarrhea.  Initial treatment for this condition may include taking steps to avoid constipation, such as increasing your intake of fiber or fluid.  Follow instructions for care as told by your health  care provider.  Contact your health care provider if you have more bleeding or your problem is getting worse rather than better.  Keep all follow-up visits as told by your health care provider. This is important. This information is not intended to replace advice given to you by your health care provider. Make sure you discuss any questions you have with your health care provider. Document  Revised: 05/19/2018 Document Reviewed: 05/19/2018 Elsevier Patient Education  [place].

## 2020-04-19 ENCOUNTER — Encounter: Payer: Self-pay | Admitting: Family Medicine

## 2020-04-19 ENCOUNTER — Ambulatory Visit: Payer: 59 | Admitting: Family Medicine

## 2020-04-19 ENCOUNTER — Other Ambulatory Visit: Payer: Self-pay

## 2020-04-19 VITALS — BP 150/94 | HR 92 | Temp 98.0°F | Wt 191.2 lb

## 2020-04-19 DIAGNOSIS — M940 Chondrocostal junction syndrome [Tietze]: Secondary | ICD-10-CM

## 2020-04-19 NOTE — Progress Notes (Signed)
   Subjective:    Patient ID: Terrence Santiago, male    DOB: May 15, 1981, 39 y.o.   MRN: [id number]  HPI He complains of a 1 week history of left-sided chest pain.  He notes that this occurred when he had a rolled over in bed and also if he takes a deep breath.  No dyspnea on exertion, cough, congestion, shortness of breath.   Review of Systems     Objective:   Physical Exam Alert and in no distress.  Tender to palpation over the left third costochondral junction.  Splinting of the area actually relieved his discomfort.  Cardiac exam shows regular rhythm without murmurs or gallops.  Lungs are clear to auscultation.       Assessment & Plan:  Costochondritis I explained the condition to him in detail.  Recommend 2 Aleve twice per day for the next 7 to 10 days for relief.  He was comfortable with that.

## 2020-04-22 ENCOUNTER — Ambulatory Visit: Payer: 59 | Admitting: Family Medicine

## 2020-05-13 ENCOUNTER — Encounter: Payer: Self-pay | Admitting: Medical

## 2020-05-13 ENCOUNTER — Other Ambulatory Visit: Payer: Self-pay

## 2020-05-13 ENCOUNTER — Ambulatory Visit: Payer: 59 | Admitting: Medical

## 2020-05-13 VITALS — BP 126/86 | HR 82 | Ht 69.0 in | Wt 191.4 lb

## 2020-05-13 DIAGNOSIS — Z7189 Other specified counseling: Secondary | ICD-10-CM | POA: Diagnosis not present

## 2020-05-13 DIAGNOSIS — Z0000 Encounter for general adult medical examination without abnormal findings: Secondary | ICD-10-CM | POA: Diagnosis not present

## 2020-05-13 DIAGNOSIS — Z23 Encounter for immunization: Secondary | ICD-10-CM | POA: Diagnosis not present

## 2020-05-13 DIAGNOSIS — Z8249 Family history of ischemic heart disease and other diseases of the circulatory system: Secondary | ICD-10-CM

## 2020-05-13 DIAGNOSIS — Z7185 Encounter for immunization safety counseling: Secondary | ICD-10-CM

## 2020-05-13 DIAGNOSIS — Z13220 Encounter for screening for lipoid disorders: Secondary | ICD-10-CM | POA: Diagnosis not present

## 2020-05-13 NOTE — Patient Instructions (Signed)
Preventative Care for Adults - Male    Thank you for coming in for your well visit today, and thank you for trusting [place] with your care!   Maintain regular health and wellness exams:  A routine yearly physical is a good way to check in with your primary care provider about your health and preventive screening. It is also an opportunity to share updates about your health and any concerns you have, and receive a thorough all-over exam.   Most health insurance companies pay for at least some preventative services.  Check with your health plan for specific coverages.  What preventative services do men need?  Adult men should have their weight and blood pressure checked regularly.   Men age 38 and older should have their cholesterol levels checked regularly.  Beginning at age 38 and continuing to age 73, men should be screened for colorectal cancer.  Certain people may need continued testing until age 38.  Updating vaccinations is part of preventative care.  Vaccinations help protect against diseases such as the flu.  Osteoporosis is a disease in which the bones lose minerals and strength as we age. Men ages 38 and over should discuss this with their caregivers  Lab tests are generally done as part of preventative care to screen for anemia and blood disorders, to screen for problems with the kidneys and liver, to screen for bladder problems, to check blood sugar, and to check your cholesterol level.  Preventative services generally include counseling about diet, exercise, avoiding tobacco, drugs, excessive alcohol consumption, and sexually transmitted infections.   Xrays and CT scans are not normally done as a preventative test, and most insurances do not pay for imaging for screening other than as discussed under cancer screens below.   On the other hand, if you have certain medical concerns, imaging may be necessary as a diagnostic test.    Your Medical Team Your medical team starts with  [place], your PCP or primary care provider.  Please use our services for your routine care such as physicals, screenings, immunizations, sick visits, and your first stop for general medical concerns.  You can call our number for after hours information for urgent questions that may need attention but cannot wait til the next business day.    Urgent care-urgent cares exist to provide care when your primary care office would typically be closed such as evenings or weekends.   Urgent care is for evaluation of urgent medical problems that do not necessarily require emergency department care, but cannot wait til the next business day when we are open.  Emergency department care-please reserve emergency department care for serious, urgent, possibly life-threatening medical problems.  This includes issues like possible stroke, heart attack, significant injury, mental health crisis, or other urgent need that requires immediate medical attention.     See your dentist office twice yearly for hygiene and cleaning visits.   Brush your teeth and floss your teeth daily.  See your eye doctor yearly for routine eye exam and screenings for glaucoma and retinal disease.    Vaccines:  Stay up to date with your tetanus shots and other required immunizations. You should have a booster for tetanus every 10 years. Be sure to get your flu shot every year, since 5%-20% of the [place] population comes down with the flu. The flu vaccine changes each year, so being vaccinated once is not enough. Get your shot in the fall, before the flu season peaks.   Other vaccines to  consider:  Pneumococcal vaccine to protect against certain types of pneumonia.  This is normally recommended for adults age 38 or older.  However, adults younger than 38 years old with certain underlying conditions such as diabetes, heart or lung disease should also receive the vaccine.  Shingles vaccine to protect against Varicella Zoster if you are older than age  20, or younger than 38 years old with certain underlying illness.  If you have not had the Shingrix vaccine, please call your insurer to inquire about coverage for the Shingrix vaccine given in 2 doses.   Some insurers cover this vaccine after age 46, some cover this after age 29.  If your insurer covers this, then call to schedule appointment to have this vaccine here  Hepatitis A vaccine to protect against a form of infection of the liver by a virus acquired from food.  Hepatitis B vaccine to protect against a form of infection of the liver by a virus acquired from blood or body fluids, particularly if you work in health care.  If you plan to travel internationally, check with your local health department for specific vaccination recommendations.  Human Papilloma Virus or HPV causes cancer of the cervix, and other infections that can be transmitted from person to person. There is a vaccine for HPV, and males should get immunized between the ages of 38 and 9. It requires a series of 3 shots.   Covid/Coronavirus - as the vaccines are becoming available, please consider vaccination if you are a health care worker, first responder, or have significant health problems such as asthma, COPD, heart disease, hypertension, diabetes, obesity, multiple medical problems, over age 35yo, or immunocompromised.       What should I know about Cancer screening? Many types of cancers can be detected early and may often be prevented. Lung Cancer  You should be screened every year for lung cancer if: ? You are a current smoker who has smoked for at least 30 years. ? You are a former smoker who has quit within the past 15 years.  Talk to your health care provider about your screening options, when you should start screening, and how often you should be screened.  Colorectal Cancer  Routine colorectal cancer screening usually begins at 38 years of age and should be repeated every 5-10 years until you are 38  years old. You may need to be screened more often if early forms of precancerous polyps or small growths are found. Your health care provider may recommend screening at an earlier age if you have risk factors for colon cancer.  Your health care provider may recommend using home test kits to check for hidden blood in the stool.  A small camera at the end of a tube can be used to examine your colon (sigmoidoscopy or colonoscopy). This checks for the earliest forms of colorectal cancer.  Prostate and Testicular Cancer  Depending on your age and overall health, your health care provider may do certain tests to screen for prostate and testicular cancer.  Talk to your health care provider about any symptoms or concerns you have about testicular or prostate cancer.  Skin Cancer  Check your skin from head to toe regularly.  Tell your health care provider about any new moles or changes in moles, especially if: ? There is a change in a mole's size, shape, or color. ? You have a mole that is larger than a pencil eraser.  Always use sunscreen. Apply sunscreen liberally and repeat  throughout the day.  Protect yourself by wearing long sleeves, pants, a wide-brimmed hat, and sunglasses when outside.   Are you up to date on cancer screenings:  COLON CANCER SCREENING: age 18  PROSTATE CANCER SCREENING:  Age 65  TESTICULAR CANCER SCREENING:  check your self monthly at home     GENERAL RECOMMENDATIONS FOR GOOD HEALTH:  Healthy diet:  Eat a variety of foods, including fruit, vegetables, animal or vegetable protein, such as meat, fish, chicken, and eggs, or beans, lentils, tofu, and grains, such as rice.  Drink plenty of water daily.  Decrease saturated fat in the diet, avoid lots of red meat, processed foods, sweets, fast foods, and fried foods.  Exercise:  Aerobic exercise helps maintain good heart health. At least 30-40 minutes of moderate-intensity exercise is recommended. For example, a  brisk walk that increases your heart rate and breathing. This should be done on most days of the week.   Find a type of exercise or a variety of exercises that you enjoy so that it becomes a part of your daily life.  Examples are running, walking, swimming, water aerobics, and biking.  For motivation and support, explore group exercise such as aerobic class, spin class, Zumba, Yoga,or  martial arts, etc.    Set exercise goals for yourself, such as a certain weight goal, walk or run in a race such as a 5k walk/run.  Speak to your primary care provider about exercise goals.  Your weight readings per our records: Wt Readings from Last 3 Encounters:  05/13/20 191 lb 6.4 oz (86.8 kg)  04/19/20 191 lb 3.2 oz (86.7 kg)  03/25/20 192 lb 12.8 oz (87.5 kg)    Body mass index is 28.26 kg/m.    Disease prevention:  If you smoke or chew tobacco, find out from your caregiver how to quit. It can literally save your life, no matter how long you have been a tobacco user. If you do not use tobacco, never begin.   Maintain a healthy diet and normal weight. Increased weight leads to problems with blood pressure and diabetes.   The Body Mass Index or BMI is a way of measuring how much of your body is fat. Having a BMI above 27 increases the risk of heart disease, diabetes, hypertension, stroke and other problems related to obesity. Your caregiver can help determine your BMI and based on it develop an exercise and dietary program to help you achieve or maintain this important measurement at a healthful level.  High blood pressure causes heart and blood vessel problems.  Persistent high blood pressure should be treated with medicine if weight loss and exercise do not work.  Your blood pressure readings per our records:     BP Readings from Last 3 Encounters:  05/13/20 126/86  04/19/20 (!) 150/94  03/25/20 (!) 142/100     Fat and cholesterol leaves deposits in your arteries that can block them. This  causes heart disease and vessel disease elsewhere in your body.  If your cholesterol is found to be high, or if you have heart disease or certain other medical conditions, then you may need to have your cholesterol monitored frequently and be treated with medication.   Ask if you should have a cardiac stress test if your history suggests this. A stress test is a test done on a treadmill that looks for heart disease. This test can find disease prior to there being a problem.    Heart disease screening:  Consider  Coronary Calcium CT   Osteoporosis is a disease in which the bones lose minerals and strength as we age. This can result in serious bone fractures. Risk of osteoporosis can be identified using a bone density scan. Men ages 83 and over should discuss this with their caregivers. Ask your caregiver whether you should be taking a calcium supplement and Vitamin D, to reduce the rate of osteoporosis.   Avoid drinking alcohol in excess (more than two drinks per day).  Avoid use of street drugs. Do not share needles with anyone. Ask for professional help if you need assistance or instructions on stopping the use of alcohol, cigarettes, and/or drugs.  Brush your teeth twice a day with fluoride toothpaste, and floss once a day. Good oral hygiene prevents tooth decay and gum disease. The problems can be painful, unattractive, and can cause other health problems. Visit your dentist for a routine oral and dental check up and preventive care every 6-12 months.   Safety:  Use seatbelts 100% of the time, whether driving or as a passenger.  Use safety devices such as hearing protection if you work in environments with loud noise or significant background noise.  Use safety glasses when doing any work that could send debris in to the eyes.  Use a helmet if you ride a bike or motorcycle.  Use appropriate safety gear for contact sports.  Talk to your caregiver about gun safety.  Use sunscreen with a SPF (or skin  protection factor) of 15 or greater.  Lighter skinned people are at a greater risk of skin cancer. Don't forget to also wear sunglasses in order to protect your eyes from too much damaging sunlight. Damaging sunlight can accelerate cataract formation.   Keep carbon monoxide and smoke detectors in your home functioning at all times. Change the batteries every 6 months or use a model that plugs into the wall.    Sexual activity: . Sex is a normal part of life and sexual activity can continue into older adulthood for many healthy people.   . If you are having erectile dysfunction issues, please follow up to discuss this further.   . If you are not in a monogamous relationship or have more than one partner, please practice safe sex.  Use condoms. Condoms are used for birth control and to help reduce the spread of sexually transmitted infections (or STIs).  Some of the STIs are gonorrhea (the clap), chlamydia, syphilis, trichomonas, herpes, HPV (human papilloma virus) and HIV (human immunodeficiency virus) which causes AIDS. The herpes, HIV and HPV are viral illnesses that have no cure. These can result in disability, cancer and death.   We are able to test for STIs here at our office.

## 2020-05-13 NOTE — Addendum Note (Signed)
Addended by: Victorio Palm on: 05/13/2020 02:44 PM   Modules accepted: Orders

## 2020-05-13 NOTE — Progress Notes (Signed)
Subjective:    Patient ID: Terrence Santiago, male    DOB: 1981-01-14, 39 y.o.   MRN: [id number]  HPI Chief Complaint  Patient presents with  . Annual Exam    with labs   Medical team: Eye doctor, dentist, and Taj Nevins, Camelia Eng, PA-C for primary care  Concerns: Just started back exercising.   He had gotten a little lax in recent months.   Review of Systems  As in subjective  Past Medical History:  Diagnosis Date  . Hyperlipidemia     Past Surgical History:  Procedure Laterality Date  . LACERATION REPAIR     left hand    Social History   Socioeconomic History  . Marital status: Single    Spouse name: Not on file  . Number of children: Not on file  . Years of education: Not on file  . Highest education level: Not on file  Occupational History  . Occupation: Health visitor: [employer]  Tobacco Use  . Smoking status: Never Smoker  . Smokeless tobacco: Never Used  Substance and Sexual Activity  . Alcohol use: Yes    Alcohol/week: 0.0 standard drinks    Comment: rare  . Drug use: No  . Sexual activity: Not on file  Other Topics Concern  . Not on file  Social History Narrative   Lives with wife and 2 children and mother in [place], works as  Marine scientist.  Exercise - circuit training, marital arts, tae kwon do.  04/2020   Social Determinants of Health   Financial Resource Strain:   . Difficulty of Paying Living Expenses:   Food Insecurity:   . Worried About Charity fundraiser in the Last Year:   . Arboriculturist in the Last Year:   Transportation Needs:   . Film/video editor (Medical):   Marland Kitchen Lack of Transportation (Non-Medical):   Physical Activity:   . Days of Exercise per Week:   . Minutes of Exercise per Session:   Stress:   . Feeling of Stress :   Social Connections:   . Frequency of Communication with Friends and Family:   . Frequency of Social Gatherings with Friends and Family:   . Attends Religious Services:   . Active Member of Clubs or  Organizations:   . Attends Archivist Meetings:   Marland Kitchen Marital Status:   Intimate Partner Violence:   . Fear of Current or Ex-Partner:   . Emotionally Abused:   Marland Kitchen Physically Abused:   . Sexually Abused:     Family History  Problem Relation Age of Onset  . Hypertension Father   . Heart disease Father 79       MI  . Cancer Maternal Uncle   . Diabetes Maternal Grandmother   . Stroke Neg Hx      Current Outpatient Medications:  Marland Kitchen  Multiple Vitamin (MULTIVITAMIN) tablet, Take 1 tablet by mouth daily., Disp: , Rfl:   No Known Allergies      Objective:   Physical Exam    BP 126/86   Pulse 82   Ht 5\' 9"  (1.753 m)   Wt 191 lb 6.4 oz (86.8 kg)   SpO2 96%   BMI 28.26 kg/m   General appearance: alert, no distress, WD/WN, AA male Skin: tattoo right upper arm, left neck with 46mm x 28mm triangular brown flat macule, right volar hand at base of 5th finger with 19mm x 1mm brown flat lesions, no worrisome lesions otherwise  Neck: supple, no lymphadenopathy, no thyromegaly, no masses, normal ROM, no bruits Chest: non tender, normal shape and expansion Heart: RRR, normal S1, S2, no murmurs Lungs: CTA bilaterally, no wheezes, rhonchi, or rales Abdomen: +bs, soft, non tender, non distended, no masses, no hepatomegaly, no splenomegaly, no bruits Back: non tender, normal ROM, no scoliosis Musculoskeletal: upper extremities non tender, no obvious deformity, normal ROM throughout, lower extremities non tender, no obvious deformity, normal ROM throughout Extremities: no edema, no cyanosis, no clubbing Pulses: 2+ symmetric, upper and lower extremities, normal cap refill Neurological: alert, oriented x 3, CN2-12 intact, strength normal upper extremities and lower extremities, sensation normal throughout, DTRs 2+ throughout, no cerebellar signs, gait normal Psychiatric: normal affect, behavior normal, pleasant  GU: normal male external genitalia, circumcised, nontender, no masses, no  hernia, no lymphadenopathy Rectal: deferred   Assessment and Plan :    Encounter Diagnoses  Name Primary?  . Encounter for health maintenance examination in adult Yes  . Need for Tdap vaccination   . Screening for lipoid disorders   . Vaccine counseling   . Family history of premature CAD      Physical exam - discussed healthy lifestyle, diet, exercise, preventative care, vaccinations, and addressed their concerns.   Advised monthly testicular cancer screening/self exam He will get me copy of vaccines at work Return for fating labs  Recommendations:  See your eye doctor yearly for routine vision care.  See your dentist yearly for routine dental care including hygiene visits twice yearly.  Eat a healthy low fat diet, get a variety of fruits and vegetables, don't eat meat every day, limit salt, and limit sweets.  Exercise regularly, preferably 40-60 minutes daily with aerobic exercise.  Get some weight bearing exercise a few times per week as well  F/u for fasting labs  Vaccine recommendations: Yearly flu shot He will get me copy of recent Covid vaccine record  Counseled on the Tdap (tetanus, diptheria, and acellular pertussis) vaccine.  Vaccine information sheet given. Tdap vaccine given after consent obtained.   [name] was seen today for annual exam.  Diagnoses and all orders for this visit:  Encounter for health maintenance examination in adult -     Cancel: Comprehensive metabolic panel -     Cancel: Lipid panel -     Cancel: CBC -     Comprehensive metabolic panel; Future -     CBC; Future -     Lipid panel; Future  Need for Tdap vaccination  Screening for lipoid disorders -     Cancel: Lipid panel -     CBC; Future  Vaccine counseling  Family history of premature CAD

## 2020-05-15 ENCOUNTER — Other Ambulatory Visit: Payer: 59

## 2020-05-15 ENCOUNTER — Other Ambulatory Visit: Payer: Self-pay

## 2020-05-15 DIAGNOSIS — Z0000 Encounter for general adult medical examination without abnormal findings: Secondary | ICD-10-CM

## 2020-05-15 DIAGNOSIS — Z13220 Encounter for screening for lipoid disorders: Secondary | ICD-10-CM

## 2020-05-16 ENCOUNTER — Other Ambulatory Visit: Payer: Self-pay | Admitting: Medical

## 2020-05-16 DIAGNOSIS — E785 Hyperlipidemia, unspecified: Secondary | ICD-10-CM

## 2020-05-16 LAB — LIPID PANEL
Chol/HDL Ratio: 6.5 ratio — ABNORMAL HIGH (ref 0.0–5.0)
Cholesterol, Total: 222 mg/dL — ABNORMAL HIGH (ref 100–199)
HDL: 34 mg/dL — ABNORMAL LOW (ref >39)
LDL Chol Calc (NIH): 173 mg/dL — ABNORMAL HIGH (ref 0–99)
Triglycerides: 86 mg/dL (ref 0–149)
VLDL Cholesterol Cal: 15 mg/dL (ref 5–40)

## 2020-05-16 LAB — COMPREHENSIVE METABOLIC PANEL
ALT: 34 IU/L (ref 0–44)
AST: 38 IU/L (ref 0–40)
Albumin/Globulin Ratio: 1.2 (ref 1.2–2.2)
Albumin: 4.1 g/dL (ref 4.0–5.0)
Alkaline Phosphatase: 107 IU/L (ref 48–121)
BUN/Creatinine Ratio: 12 (ref 9–20)
BUN: 12 mg/dL (ref 6–20)
Bilirubin Total: 0.8 mg/dL (ref 0.0–1.2)
CO2: 24 mmol/L (ref 20–29)
Calcium: 9.8 mg/dL (ref 8.7–10.2)
Chloride: 100 mmol/L (ref 96–106)
Creatinine, Ser: 0.97 mg/dL (ref 0.76–1.27)
GFR calc Af Amer: 114 mL/min/{1.73_m2} (ref >59)
GFR calc non Af Amer: 99 mL/min/{1.73_m2} (ref >59)
Globulin, Total: 3.4 g/dL (ref 1.5–4.5)
Glucose: 91 mg/dL (ref 65–99)
Potassium: 4.8 mmol/L (ref 3.5–5.2)
Sodium: 140 mmol/L (ref 134–144)
Total Protein: 7.5 g/dL (ref 6.0–8.5)

## 2020-05-16 LAB — CBC
Hematocrit: 47.2 % (ref 37.5–51.0)
Hemoglobin: 14.9 g/dL (ref 13.0–17.7)
MCH: 27.2 pg (ref 26.6–33.0)
MCHC: 31.6 g/dL (ref 31.5–35.7)
MCV: 86 fL (ref 79–97)
Platelets: 408 10*3/uL (ref 150–450)
RBC: 5.48 x10E6/uL (ref 4.14–5.80)
RDW: 13 % (ref 11.6–15.4)
WBC: 11 10*3/uL — ABNORMAL HIGH (ref 3.4–10.8)

## 2020-05-16 MED ORDER — ROSUVASTATIN CALCIUM 10 MG PO TABS
10.0000 mg | ORAL_TABLET | Freq: Every day | ORAL | 3 refills | Status: DC
Start: 2020-05-16 — End: 2021-06-09

## 2020-07-29 ENCOUNTER — Encounter: Payer: Self-pay | Admitting: Family Medicine

## 2020-07-29 ENCOUNTER — Ambulatory Visit: Payer: 59 | Admitting: Family Medicine

## 2020-07-29 ENCOUNTER — Other Ambulatory Visit: Payer: Self-pay

## 2020-07-29 VITALS — BP 132/80 | HR 71 | Temp 98.2°F | Wt 188.0 lb

## 2020-07-29 DIAGNOSIS — N200 Calculus of kidney: Secondary | ICD-10-CM | POA: Diagnosis not present

## 2020-07-29 NOTE — Progress Notes (Signed)
   Subjective:    Patient ID: Terrence Santiago, male    DOB: Jan 26, 1981, 39 y.o.   MRN: [id number]  HPI Chief Complaint  Patient presents with  . stone    stone in urine. passing stones, no pain when passing   States he passed 2 kidney stones this morning and brought them in with him today. States he has a history of renal stones and has never been worked up for this. Denies having any imaging or seeing urology.  States he has passed at least 5 stones over the past few months. Denies urinary symptoms.   Denies fever, chills, chest pain, palpitations, shortness of breath, abdominal pain, back pain, N/V/D, urinary symptoms  History of back pain but has not been having any of his usual back pain since he started working out again.   Reviewed allergies, medications, past medical, surgical, family, and social history.    Review of Systems Pertinent positives and negatives in the history of present illness.     Objective:   Physical Exam BP 132/80   Pulse 71   Temp 98.2 F (36.8 C)   Wt 188 lb (85.3 kg)   BMI 27.76 kg/m   Alert and in no distress.  Cardiac exam shows a regular sinus rhythm without murmurs or gallops. Lungs are clear to auscultation.  Abdomen is soft, nondistended, nontender, normal bowel sounds, no rebound or guarding.  No CVA tenderness.       Assessment & Plan:  Renal calculi - Plan: Calculi, with Photograph, CBC with Differential/Platelet, Comprehensive metabolic panel, CT Abdomen Pelvis Wo Contrast  He is a walk-in patient today here with renal stones which he passed prior to his visit.  States he has been passing multiple stones over the past few months.  This is a fairly new issue.  He has not been worked up for this in the past.  His PCP is out of town so I am seeing him for this issue today.  He is not in any distress.  No red flag symptoms.  I will order stone analysis, labs and get a CT to look for any complicated stones which he may need a referral to  urology for.

## 2020-07-30 LAB — CBC WITH DIFFERENTIAL/PLATELET
Basophils Absolute: 0.1 10*3/uL (ref 0.0–0.2)
Basos: 1 %
EOS (ABSOLUTE): 0.2 10*3/uL (ref 0.0–0.4)
Eos: 2 %
Hematocrit: 44.7 % (ref 37.5–51.0)
Hemoglobin: 15 g/dL (ref 13.0–17.7)
Immature Grans (Abs): 0 10*3/uL (ref 0.0–0.1)
Immature Granulocytes: 0 %
Lymphocytes Absolute: 4.1 10*3/uL — ABNORMAL HIGH (ref 0.7–3.1)
Lymphs: 45 %
MCH: 28.4 pg (ref 26.6–33.0)
MCHC: 33.6 g/dL (ref 31.5–35.7)
MCV: 85 fL (ref 79–97)
Monocytes Absolute: 0.6 10*3/uL (ref 0.1–0.9)
Monocytes: 7 %
Neutrophils Absolute: 4.1 10*3/uL (ref 1.4–7.0)
Neutrophils: 45 %
Platelets: 364 10*3/uL (ref 150–450)
RBC: 5.28 x10E6/uL (ref 4.14–5.80)
RDW: 12.6 % (ref 11.6–15.4)
WBC: 9 10*3/uL (ref 3.4–10.8)

## 2020-07-30 LAB — COMPREHENSIVE METABOLIC PANEL
ALT: 32 IU/L (ref 0–44)
AST: 30 IU/L (ref 0–40)
Albumin/Globulin Ratio: 1.3 (ref 1.2–2.2)
Albumin: 4.7 g/dL (ref 4.0–5.0)
Alkaline Phosphatase: 102 IU/L (ref 48–121)
BUN/Creatinine Ratio: 12 (ref 9–20)
BUN: 11 mg/dL (ref 6–20)
Bilirubin Total: 0.7 mg/dL (ref 0.0–1.2)
CO2: 25 mmol/L (ref 20–29)
Calcium: 9.7 mg/dL (ref 8.7–10.2)
Chloride: 102 mmol/L (ref 96–106)
Creatinine, Ser: 0.95 mg/dL (ref 0.76–1.27)
GFR calc Af Amer: 117 mL/min/{1.73_m2} (ref >59)
GFR calc non Af Amer: 101 mL/min/{1.73_m2} (ref >59)
Globulin, Total: 3.6 g/dL (ref 1.5–4.5)
Glucose: 88 mg/dL (ref 65–99)
Potassium: 4.4 mmol/L (ref 3.5–5.2)
Sodium: 142 mmol/L (ref 134–144)
Total Protein: 8.3 g/dL (ref 6.0–8.5)

## 2020-07-30 NOTE — Progress Notes (Signed)
His labs are ok. One type of his blood cells, the lymphocytes, are mildly elevated. He can recheck this at his follow up with [facility].

## 2020-08-05 LAB — CALCULI, WITH PHOTOGRAPH (CLINICAL LAB)
Calcium Oxalate Monohydrate: 95 %
Hydroxyapatite: 5 %
Weight Calculi: 23 mg

## 2020-08-06 NOTE — Progress Notes (Signed)
The stones he brought in were mostly calcium oxalate (95%). His serum calcium is normal so he does not need to decrease the calcium in his diet but studies show that not taking high dose vitamin C or a calcium supplement may help with these types of stone production. We will see what his CT shows and refer him to urology as needed. He should also follow up with Kristian Covey, his PCP as recommended.

## 2020-08-08 ENCOUNTER — Other Ambulatory Visit: Payer: 59

## 2020-08-08 ENCOUNTER — Other Ambulatory Visit: Payer: Self-pay

## 2020-08-08 ENCOUNTER — Encounter: Payer: Self-pay | Admitting: Family Medicine

## 2020-08-08 ENCOUNTER — Ambulatory Visit
Admission: RE | Admit: 2020-08-08 | Discharge: 2020-08-08 | Disposition: A | Discharge status: Home / Self Care | Payer: 59 | Source: Ambulatory Visit | Attending: Family Medicine | Admitting: Family Medicine

## 2020-08-08 DIAGNOSIS — N200 Calculus of kidney: Secondary | ICD-10-CM | POA: Insufficient documentation

## 2020-08-08 DIAGNOSIS — N2889 Other specified disorders of kidney and ureter: Secondary | ICD-10-CM

## 2020-08-08 DIAGNOSIS — E785 Hyperlipidemia, unspecified: Secondary | ICD-10-CM

## 2020-08-08 HISTORY — DX: Other specified disorders of kidney and ureter: N28.89

## 2020-08-08 NOTE — Progress Notes (Signed)
His CT shows numerous calculi (stones) within a large cystic area (5.8 x 5.6 cm) that needs to be evaluated by urology. Please put in a referral to urology for calyceal diverticulum and numerous renal calculi.

## 2020-08-09 ENCOUNTER — Other Ambulatory Visit: Payer: Self-pay | Admitting: Internal Medicine

## 2020-08-09 DIAGNOSIS — N200 Calculus of kidney: Secondary | ICD-10-CM

## 2020-08-09 DIAGNOSIS — N2889 Other specified disorders of kidney and ureter: Secondary | ICD-10-CM

## 2020-08-09 LAB — LIPID PANEL
Chol/HDL Ratio: 3.4 ratio (ref 0.0–5.0)
Cholesterol, Total: 121 mg/dL (ref 100–199)
HDL: 36 mg/dL — ABNORMAL LOW (ref >39)
LDL Chol Calc (NIH): 74 mg/dL (ref 0–99)
Triglycerides: 47 mg/dL (ref 0–149)
VLDL Cholesterol Cal: 11 mg/dL (ref 5–40)

## 2020-08-09 LAB — ALT: ALT: 25 IU/L (ref 0–44)

## 2020-08-12 ENCOUNTER — Telehealth: Payer: Self-pay | Admitting: Medical

## 2020-08-12 NOTE — Telephone Encounter (Signed)
Pt called and states he would like to speak to you concerning his CT results. Please call pt at [phone].

## 2020-08-13 NOTE — Telephone Encounter (Signed)
Call and see what is a good time for me to call him today

## 2020-08-13 NOTE — Telephone Encounter (Signed)
Patient stated you can call him at any time.

## 2020-08-15 NOTE — Telephone Encounter (Signed)
I spoke to patient about his recent unusual kidney finding on CT scan.  He will continue plan to follow-up with urology

## 2020-08-19 ENCOUNTER — Telehealth: Payer: Self-pay | Admitting: Medical

## 2020-08-19 NOTE — Telephone Encounter (Signed)
Appointment has been scheduled and patient has been informed.

## 2020-08-19 NOTE — Telephone Encounter (Signed)
Pt states he still hasn't heard from [facility], said he has tried to call them and can't get anyone of the phone, said on Friday he held for over an hour.  He wants to know if you can try to reach them and to get him an appt ASAP.  He has an active kidney stone.  Thanks

## 2020-12-03 ENCOUNTER — Other Ambulatory Visit: Payer: Self-pay | Admitting: Urology

## 2020-12-21 DIAGNOSIS — K8020 Calculus of gallbladder without cholecystitis without obstruction: Secondary | ICD-10-CM

## 2020-12-21 HISTORY — DX: Calculus of gallbladder without cholecystitis without obstruction: K80.20

## 2021-01-13 NOTE — Patient Instructions (Addendum)
DUE TO COVID-19 ONLY ONE VISITOR IS ALLOWED TO COME WITH YOU AND STAY IN THE WAITING ROOM ONLY DURING PRE OP AND PROCEDURE DAY OF SURGERY. THE 1 VISITOR  MAY VISIT WITH YOU AFTER SURGERY IN YOUR PRIVATE ROOM DURING VISITING HOURS ONLY!  YOU NEED TO HAVE A COVID 19 TEST ON_2/1______ @_10 :15______, THIS TEST MUST BE DONE BEFORE SURGERY,  COVID TESTING SITE [address] [place] [address], IT IS ON THE RIGHT GOING OUT [address] APPROXIMATELY  2 MINUTES PAST [employer] ON THE RIGHT. ONCE YOUR COVID TEST IS COMPLETED,  PLEASE BEGIN THE QUARANTINE INSTRUCTIONS AS OUTLINED IN YOUR HANDOUT.                Jackob Vanantwerp    Your procedure is scheduled on: 01/24/21   Report to [facility] Main  Entrance   Report to admitting at  10:30 AM     Call this number if you have problems the morning of surgery [phone]    Remember: Do not eat food or drink liquids :After Midnight.    BRUSH YOUR TEETH MORNING OF SURGERY AND RINSE YOUR MOUTH OUT, NO CHEWING GUM CANDY OR MINTS.     Take these medicines the morning of surgery with A SIP OF WATER: None                                 You may not have any metal on your body including               piercings  Do not wear jewelry, lotions, powders or deodorant                       Men may shave face and neck.   Do not bring valuables to the hospital. [facility] IS NOT             RESPONSIBLE   FOR VALUABLES.  Contacts, dentures or bridgework may not be worn into surgery.      Patients discharged the day of surgery will not be allowed to drive home.   IF YOU ARE HAVING SURGERY AND GOING HOME THE SAME DAY, YOU MUST HAVE AN ADULT TO DRIVE YOU HOME AND BE WITH YOU FOR 24 HOURS. YOU MAY GO HOME BY TAXI OR UBER OR ORTHERWISE, BUT AN ADULT MUST ACCOMPANY YOU HOME AND STAY WITH YOU FOR 24 HOURS.  Name and phone number of your driver:  Special Instructions: N/A              Please read over the following fact sheets you  were given: _____________________________________________________________________             [facility] - Preparing for Surgery Before surgery, you can play an important role.   Because skin is not sterile, your skin needs to be as free of germs as possible.   You can reduce the number of germs on your skin by washing with CHG (chlorahexidine gluconate) soap before surgery.   CHG is an antiseptic cleaner which kills germs and bonds with the skin to continue killing germs even after washing. Please DO NOT use if you have an allergy to CHG or antibacterial soaps.   If your skin becomes reddened/irritated stop using the CHG and inform your nurse when you arrive at Short Stay. You may shave your face/neck.  Please follow these instructions carefully:  1.  Shower with CHG Soap the night before surgery and the  morning of Surgery.  2.  If you choose to wash your hair, wash your hair first as usual with your  normal  shampoo.  3.  After you shampoo, rinse your hair and body thoroughly to remove the  shampoo.                                        4.  Use CHG as you would any other liquid soap.  You can apply chg directly  to the skin and wash                       Gently with a scrungie or clean washcloth.  5.  Apply the CHG Soap to your body ONLY FROM THE NECK DOWN.   Do not use on face/ open                           Wound or open sores. Avoid contact with eyes, ears mouth and genitals (private parts).                       Wash face,  Genitals (private parts) with your normal soap.             6.  Wash thoroughly, paying special attention to the area where your surgery  will be performed.  7.  Thoroughly rinse your body with warm water from the neck down.  8.  DO NOT shower/wash with your normal soap after using and rinsing off  the CHG Soap.             9.  Pat yourself dry with a clean towel.            10.  Wear clean pajamas.            11.  Place clean sheets on your bed the night of  your first shower and do not  sleep with pets. Day of Surgery : Do not apply any lotions/deodorants the morning of surgery.  Please wear clean clothes to the hospital/surgery center.  FAILURE TO FOLLOW THESE INSTRUCTIONS MAY RESULT IN THE CANCELLATION OF YOUR SURGERY PATIENT SIGNATURE_________________________________  NURSE SIGNATURE__________________________________  ________________________________________________________________________

## 2021-01-14 ENCOUNTER — Other Ambulatory Visit: Payer: Self-pay

## 2021-01-14 ENCOUNTER — Encounter (HOSPITAL_COMMUNITY): Payer: Self-pay

## 2021-01-14 ENCOUNTER — Encounter (HOSPITAL_COMMUNITY)
Admission: RE | Admit: 2021-01-14 | Discharge: 2021-01-14 | Disposition: A | Discharge status: Home / Self Care | Payer: 59 | Source: Ambulatory Visit | Attending: Urology | Admitting: Urology

## 2021-01-14 DIAGNOSIS — Z01812 Encounter for preprocedural laboratory examination: Secondary | ICD-10-CM | POA: Insufficient documentation

## 2021-01-14 HISTORY — DX: Personal history of urinary calculi: Z87.442

## 2021-01-14 LAB — BASIC METABOLIC PANEL
Anion gap: 8 (ref 5–15)
BUN: 10 mg/dL (ref 6–20)
CO2: 29 mmol/L (ref 22–32)
Calcium: 9.3 mg/dL (ref 8.9–10.3)
Chloride: 102 mmol/L (ref 98–111)
Creatinine, Ser: 0.83 mg/dL (ref 0.61–1.24)
GFR, Estimated: >60 mL/min (ref >60)
Glucose, Bld: 103 mg/dL — ABNORMAL HIGH (ref 70–99)
Potassium: 4.1 mmol/L (ref 3.5–5.1)
Sodium: 139 mmol/L (ref 135–145)

## 2021-01-14 LAB — CBC
HCT: 47.7 % (ref 39.0–52.0)
Hemoglobin: 15.7 g/dL (ref 13.0–17.0)
MCH: 28.8 pg (ref 26.0–34.0)
MCHC: 32.9 g/dL (ref 30.0–36.0)
MCV: 87.5 fL (ref 80.0–100.0)
Platelets: 365 10*3/uL (ref 150–400)
RBC: 5.45 MIL/uL (ref 4.22–5.81)
RDW: 12.4 % (ref 11.5–15.5)
WBC: 8.3 10*3/uL (ref 4.0–10.5)
nRBC: 0 % (ref 0.0–0.2)

## 2021-01-14 NOTE — Progress Notes (Signed)
COVID Vaccine Completed:Yes  Date COVID Vaccine completed:$/12/21- Booster 12/30/20 COVID vaccine manufacturer: Pfizer     PCP - Crosby Oyster PA Cardiologist - no  Chest x-ray - no EKG - no Stress Test - no ECHO -no  Cardiac Cath - no Pacemaker/ICD device last checked:NA  Sleep Study - no CPAP -   Fasting Blood Sugar - NA Checks Blood Sugar _____ times a day  Blood Thinner Instructions:NA Aspirin Instructions: Last Dose:  Anesthesia review:   Patient denies shortness of breath, fever, cough and chest pain at PAT appointment yes  Patient verbalized understanding of instructions that were given to them at the PAT appointment. Patient was also instructed that they will need to review over the PAT instructions again at home before surgery.Yes Pt works out regularly and reports no SOB with any activities.

## 2021-01-21 ENCOUNTER — Other Ambulatory Visit (HOSPITAL_COMMUNITY)
Admission: RE | Admit: 2021-01-21 | Discharge: 2021-01-21 | Disposition: A | Discharge status: Home / Self Care | Payer: 59 | Source: Ambulatory Visit | Attending: Urology | Admitting: Urology

## 2021-01-21 DIAGNOSIS — Z01812 Encounter for preprocedural laboratory examination: Secondary | ICD-10-CM | POA: Insufficient documentation

## 2021-01-21 DIAGNOSIS — Z20822 Contact with and (suspected) exposure to covid-19: Secondary | ICD-10-CM | POA: Diagnosis not present

## 2021-01-21 LAB — SARS CORONAVIRUS 2 (TAT 6-24 HRS): SARS Coronavirus 2: NEGATIVE

## 2021-01-23 ENCOUNTER — Other Ambulatory Visit: Payer: Self-pay

## 2021-01-23 ENCOUNTER — Ambulatory Visit: Payer: 59 | Admitting: Family Medicine

## 2021-01-23 VITALS — BP 138/90 | HR 92 | Temp 97.5°F | Wt 189.4 lb

## 2021-01-23 DIAGNOSIS — M7918 Myalgia, other site: Secondary | ICD-10-CM | POA: Diagnosis not present

## 2021-01-23 NOTE — Patient Instructions (Signed)
Use the pain medicine that they are going to gave you tomorrow for your procedure to help with this musculoskeletal pain.  You can also probably take Tylenol as well as what they are going to get you

## 2021-01-23 NOTE — Progress Notes (Signed)
   Subjective:    Patient ID: Terrence Santiago, male    DOB: 25-Dec-1980, 40 y.o.   MRN: [id number]  HPI He is here for consult concerning a 2-day history of left-sided musculoskeletal type pain.  Pain is worse with breathing and with certain movements.  He has no previous history of injury to that area.  No shortness of breath or chest pressure, irregular heart rate.  He is scheduled for a urologic procedure tomorrow for kidney stones.   Review of Systems     Objective:   Physical Exam Alert and in no distress.  Slight tenderness palpation along the left costochondral area but no point tenderness.  Lungs are clear to auscultation.  Cardiac exam shows regular rhythm without murmurs or gallops.       Assessment & Plan:  Musculoskeletal pain I explained that this is mainly musculoskeletal pain and definitely not heart related.  Discussed use of Tylenol and/or ibuprofen.  He will be getting pain medication tomorrow postoperatively so recommend he use that first

## 2021-01-24 ENCOUNTER — Ambulatory Visit (HOSPITAL_COMMUNITY): Payer: 59 | Admitting: Certified Registered"

## 2021-01-24 ENCOUNTER — Encounter (HOSPITAL_COMMUNITY): Payer: Self-pay | Admitting: Urology

## 2021-01-24 ENCOUNTER — Encounter (HOSPITAL_COMMUNITY)
Admission: RE | Disposition: A | Discharge status: Home / Self Care | Payer: Self-pay | Source: Ambulatory Visit | Attending: Urology

## 2021-01-24 ENCOUNTER — Observation Stay (HOSPITAL_COMMUNITY)
Admission: RE | Admit: 2021-01-24 | Discharge: 2021-01-25 | Disposition: A | Discharge status: Home / Self Care | Payer: 59 | Source: Ambulatory Visit | Attending: Urology | Admitting: Urology

## 2021-01-24 DIAGNOSIS — E7800 Pure hypercholesterolemia, unspecified: Secondary | ICD-10-CM | POA: Diagnosis not present

## 2021-01-24 DIAGNOSIS — N200 Calculus of kidney: Secondary | ICD-10-CM | POA: Insufficient documentation

## 2021-01-24 DIAGNOSIS — N2889 Other specified disorders of kidney and ureter: Principal | ICD-10-CM | POA: Insufficient documentation

## 2021-01-24 HISTORY — PX: ROBOTIC ASSITED PARTIAL NEPHRECTOMY: SHX6087

## 2021-01-24 LAB — TYPE AND SCREEN
ABO/RH(D): O POS
Antibody Screen: NEGATIVE

## 2021-01-24 LAB — HEMOGLOBIN AND HEMATOCRIT, BLOOD
HCT: 45.7 % (ref 39.0–52.0)
Hemoglobin: 14.8 g/dL (ref 13.0–17.0)

## 2021-01-24 LAB — ABO/RH: ABO/RH(D): O POS

## 2021-01-24 SURGERY — NEPHRECTOMY, PARTIAL, ROBOT-ASSISTED
Anesthesia: General | Laterality: Left

## 2021-01-24 MED ORDER — FENTANYL CITRATE (PF) 250 MCG/5ML IJ SOLN
INTRAMUSCULAR | Status: AC
  Filled 2021-01-24: qty 5

## 2021-01-24 MED ORDER — ONDANSETRON HCL 4 MG/2ML IJ SOLN: 4.0000 mg | INTRAMUSCULAR | Status: DC | PRN

## 2021-01-24 MED ORDER — PROMETHAZINE HCL 12.5 MG PO TABS: 12.5000 mg | ORAL_TABLET | ORAL | 0 refills | Status: DC | PRN

## 2021-01-24 MED ORDER — ONDANSETRON HCL 4 MG/2ML IJ SOLN
INTRAMUSCULAR | Status: DC | PRN
  Administered 2021-01-24: 4 mg via INTRAVENOUS

## 2021-01-24 MED ORDER — ROCURONIUM BROMIDE 10 MG/ML (PF) SYRINGE
PREFILLED_SYRINGE | INTRAVENOUS | Status: AC
  Filled 2021-01-24: qty 10

## 2021-01-24 MED ORDER — ACETAMINOPHEN 325 MG PO TABS: 650.0000 mg | ORAL_TABLET | ORAL | Status: DC | PRN

## 2021-01-24 MED ORDER — "VISTASEAL 4 ML SINGLE DOSE KIT "
4.0000 mL | PACK | Freq: Once | CUTANEOUS | Status: DC
  Filled 2021-01-24: qty 4

## 2021-01-24 MED ORDER — DEXAMETHASONE SODIUM PHOSPHATE 10 MG/ML IJ SOLN
INTRAMUSCULAR | Status: DC | PRN
  Administered 2021-01-24: 8 mg via INTRAVENOUS

## 2021-01-24 MED ORDER — PROPOFOL 10 MG/ML IV BOLUS
INTRAVENOUS | Status: DC | PRN
  Administered 2021-01-24: 200 mg via INTRAVENOUS

## 2021-01-24 MED ORDER — MIDAZOLAM HCL 2 MG/2ML IJ SOLN
INTRAMUSCULAR | Status: AC
  Filled 2021-01-24: qty 2

## 2021-01-24 MED ORDER — PROPOFOL 10 MG/ML IV BOLUS
INTRAVENOUS | Status: AC
  Filled 2021-01-24: qty 20

## 2021-01-24 MED ORDER — LIDOCAINE 2% (20 MG/ML) 5 ML SYRINGE
INTRAMUSCULAR | Status: DC | PRN
  Administered 2021-01-24: 40 mg via INTRAVENOUS
  Administered 2021-01-24: 60 mg via INTRAVENOUS

## 2021-01-24 MED ORDER — SUGAMMADEX SODIUM 200 MG/2ML IV SOLN
INTRAVENOUS | Status: DC | PRN
  Administered 2021-01-24: 180 mg via INTRAVENOUS

## 2021-01-24 MED ORDER — BUPIVACAINE LIPOSOME 1.3 % IJ SUSP
20.0000 mL | Freq: Once | INTRAMUSCULAR | Status: AC
  Administered 2021-01-24: 20 mL
  Filled 2021-01-24: qty 20

## 2021-01-24 MED ORDER — HYDROCODONE-ACETAMINOPHEN 5-325 MG PO TABS: 1.0000 | ORAL_TABLET | Freq: Four times a day (QID) | ORAL | 0 refills | Status: DC | PRN

## 2021-01-24 MED ORDER — LABETALOL HCL 5 MG/ML IV SOLN
INTRAVENOUS | Status: DC | PRN
  Administered 2021-01-24: 10 mg via INTRAVENOUS

## 2021-01-24 MED ORDER — ROSUVASTATIN CALCIUM 10 MG PO TABS
10.0000 mg | ORAL_TABLET | Freq: Every day | ORAL | Status: DC
  Administered 2021-01-24 – 2021-01-25 (×2): 10 mg via ORAL
  Filled 2021-01-24 (×2): qty 1

## 2021-01-24 MED ORDER — OXYCODONE HCL 5 MG PO TABS: 5.0000 mg | ORAL_TABLET | Freq: Once | ORAL | Status: DC | PRN

## 2021-01-24 MED ORDER — LACTATED RINGERS IR SOLN
Status: DC | PRN
  Administered 2021-01-24: 1000 mL

## 2021-01-24 MED ORDER — LACTATED RINGERS IV SOLN: INTRAVENOUS | Status: DC

## 2021-01-24 MED ORDER — FENTANYL CITRATE (PF) 100 MCG/2ML IJ SOLN
INTRAMUSCULAR | Status: DC | PRN
  Administered 2021-01-24 (×3): 50 ug via INTRAVENOUS
  Administered 2021-01-24: 100 ug via INTRAVENOUS

## 2021-01-24 MED ORDER — BACITRACIN-NEOMYCIN-POLYMYXIN 400-5-5000 EX OINT: 1.0000 "application " | TOPICAL_OINTMENT | Freq: Three times a day (TID) | CUTANEOUS | Status: DC | PRN

## 2021-01-24 MED ORDER — DIPHENHYDRAMINE HCL 12.5 MG/5ML PO ELIX: 12.5000 mg | ORAL_SOLUTION | Freq: Four times a day (QID) | ORAL | Status: DC | PRN

## 2021-01-24 MED ORDER — OXYCODONE HCL 5 MG/5ML PO SOLN: 5.0000 mg | Freq: Once | ORAL | Status: DC | PRN

## 2021-01-24 MED ORDER — MEPERIDINE HCL 50 MG/ML IJ SOLN
6.2500 mg | INTRAMUSCULAR | Status: DC | PRN
  Administered 2021-01-24: 12.5 mg via INTRAVENOUS

## 2021-01-24 MED ORDER — HYDROMORPHONE HCL 1 MG/ML IJ SOLN
0.5000 mg | INTRAMUSCULAR | Status: DC | PRN
  Administered 2021-01-25: 1 mg via INTRAVENOUS
  Filled 2021-01-24: qty 1

## 2021-01-24 MED ORDER — DOCUSATE SODIUM 100 MG PO CAPS
100.0000 mg | ORAL_CAPSULE | Freq: Two times a day (BID) | ORAL | Status: DC
  Administered 2021-01-24 – 2021-01-25 (×2): 100 mg via ORAL
  Filled 2021-01-24 (×2): qty 1

## 2021-01-24 MED ORDER — ONDANSETRON HCL 4 MG/2ML IJ SOLN
INTRAMUSCULAR | Status: AC
  Filled 2021-01-24: qty 2

## 2021-01-24 MED ORDER — ONDANSETRON HCL 4 MG/2ML IJ SOLN: 4.0000 mg | Freq: Once | INTRAMUSCULAR | Status: DC | PRN

## 2021-01-24 MED ORDER — SODIUM CHLORIDE (PF) 0.9 % IJ SOLN
INTRAMUSCULAR | Status: AC
  Filled 2021-01-24: qty 20

## 2021-01-24 MED ORDER — DEXTROSE-NACL 5-0.45 % IV SOLN
INTRAVENOUS | Status: DC
  Administered 2021-01-24: 1000 mL via INTRAVENOUS

## 2021-01-24 MED ORDER — LIDOCAINE HCL (PF) 2 % IJ SOLN
INTRAMUSCULAR | Status: AC
  Filled 2021-01-24: qty 5

## 2021-01-24 MED ORDER — ACETAMINOPHEN 10 MG/ML IV SOLN: 1000.0000 mg | Freq: Once | INTRAVENOUS | Status: DC | PRN

## 2021-01-24 MED ORDER — LABETALOL HCL 5 MG/ML IV SOLN
INTRAVENOUS | Status: AC
  Filled 2021-01-24: qty 4

## 2021-01-24 MED ORDER — BELLADONNA ALKALOIDS-OPIUM 16.2-60 MG RE SUPP: 1.0000 | Freq: Four times a day (QID) | RECTAL | Status: DC | PRN

## 2021-01-24 MED ORDER — CHLORHEXIDINE GLUCONATE 0.12 % MT SOLN
15.0000 mL | Freq: Once | OROMUCOSAL | Status: AC
  Administered 2021-01-24: 15 mL via OROMUCOSAL

## 2021-01-24 MED ORDER — SODIUM CHLORIDE (PF) 0.9 % IJ SOLN
INTRAMUSCULAR | Status: DC | PRN
  Administered 2021-01-24: 20 mL

## 2021-01-24 MED ORDER — DIPHENHYDRAMINE HCL 50 MG/ML IJ SOLN: 12.5000 mg | Freq: Four times a day (QID) | INTRAMUSCULAR | Status: DC | PRN

## 2021-01-24 MED ORDER — ACETAMINOPHEN 325 MG PO TABS: 325.0000 mg | ORAL_TABLET | ORAL | Status: DC | PRN

## 2021-01-24 MED ORDER — ACETAMINOPHEN 160 MG/5ML PO SOLN: 325.0000 mg | ORAL | Status: DC | PRN

## 2021-01-24 MED ORDER — PHENYLEPHRINE 40 MCG/ML (10ML) SYRINGE FOR IV PUSH (FOR BLOOD PRESSURE SUPPORT)
PREFILLED_SYRINGE | INTRAVENOUS | Status: AC
  Filled 2021-01-24: qty 10

## 2021-01-24 MED ORDER — ORAL CARE MOUTH RINSE: 15.0000 mL | Freq: Once | OROMUCOSAL | Status: AC

## 2021-01-24 MED ORDER — OXYCODONE HCL 5 MG PO TABS
5.0000 mg | ORAL_TABLET | ORAL | Status: DC | PRN
  Administered 2021-01-24 – 2021-01-25 (×2): 5 mg via ORAL
  Filled 2021-01-24 (×2): qty 1

## 2021-01-24 MED ORDER — DOCUSATE SODIUM 100 MG PO CAPS: 100.0000 mg | ORAL_CAPSULE | Freq: Two times a day (BID) | ORAL | Status: DC

## 2021-01-24 MED ORDER — MIDAZOLAM HCL 2 MG/2ML IJ SOLN
INTRAMUSCULAR | Status: DC | PRN
  Administered 2021-01-24: 2 mg via INTRAVENOUS

## 2021-01-24 MED ORDER — DEXAMETHASONE SODIUM PHOSPHATE 10 MG/ML IJ SOLN
INTRAMUSCULAR | Status: AC
  Filled 2021-01-24: qty 1

## 2021-01-24 MED ORDER — MEPERIDINE HCL 50 MG/ML IJ SOLN
INTRAMUSCULAR | Status: AC
  Filled 2021-01-24: qty 1

## 2021-01-24 MED ORDER — FENTANYL CITRATE (PF) 100 MCG/2ML IJ SOLN
25.0000 ug | INTRAMUSCULAR | Status: DC | PRN
Start: 2021-01-24 — End: 2021-01-24

## 2021-01-24 MED ORDER — PHENYLEPHRINE 40 MCG/ML (10ML) SYRINGE FOR IV PUSH (FOR BLOOD PRESSURE SUPPORT)
PREFILLED_SYRINGE | INTRAVENOUS | Status: DC | PRN
  Administered 2021-01-24: 120 ug via INTRAVENOUS

## 2021-01-24 MED ORDER — CEFAZOLIN SODIUM-DEXTROSE 2-4 GM/100ML-% IV SOLN
2.0000 g | Freq: Once | INTRAVENOUS | Status: AC
  Administered 2021-01-24: 2 g via INTRAVENOUS
  Filled 2021-01-24: qty 100

## 2021-01-24 MED ORDER — ROCURONIUM BROMIDE 10 MG/ML (PF) SYRINGE
PREFILLED_SYRINGE | INTRAVENOUS | Status: DC | PRN
  Administered 2021-01-24: 80 mg via INTRAVENOUS
  Administered 2021-01-24: 20 mg via INTRAVENOUS
  Administered 2021-01-24: 10 mg via INTRAVENOUS
  Administered 2021-01-24: 20 mg via INTRAVENOUS
  Administered 2021-01-24: 10 mg via INTRAVENOUS

## 2021-01-24 SURGICAL SUPPLY — 70 items
APPLICATOR VISTASEAL 35 (MISCELLANEOUS) ×2 IMPLANT
CHLORAPREP W/TINT 26 (MISCELLANEOUS) ×2 IMPLANT
CLIP SUT LAPRA TY ABSORB (SUTURE) ×4 IMPLANT
CLIP VESOLOCK LG 6/CT PURPLE (CLIP) ×4 IMPLANT
CLIP VESOLOCK MED LG 6/CT (CLIP) ×8 IMPLANT
CLIP VESOLOCK XL 6/CT (CLIP) IMPLANT
COVER SURGICAL LIGHT HANDLE (MISCELLANEOUS) ×2 IMPLANT
COVER TIP SHEARS 8 DVNC (MISCELLANEOUS) ×1 IMPLANT
COVER TIP SHEARS 8MM DA VINCI (MISCELLANEOUS) ×1
COVER WAND RF STERILE (DRAPES) IMPLANT
CUTTER ECHEON FLEX ENDO 45 340 (ENDOMECHANICALS) IMPLANT
DECANTER SPIKE VIAL GLASS SM (MISCELLANEOUS) ×2 IMPLANT
DERMABOND ADVANCED (GAUZE/BANDAGES/DRESSINGS) ×1
DERMABOND ADVANCED .7 DNX12 (GAUZE/BANDAGES/DRESSINGS) ×1 IMPLANT
DRAIN CHANNEL 15F RND FF 3/16 (WOUND CARE) ×2 IMPLANT
DRAPE ARM DVNC X/XI (DISPOSABLE) ×4 IMPLANT
DRAPE COLUMN DVNC XI (DISPOSABLE) ×1 IMPLANT
DRAPE DA VINCI XI ARM (DISPOSABLE) ×4
DRAPE DA VINCI XI COLUMN (DISPOSABLE) ×1
DRAPE INCISE IOBAN 66X45 STRL (DRAPES) ×2 IMPLANT
DRAPE SHEET LG 3/4 BI-LAMINATE (DRAPES) ×2 IMPLANT
DRSG TEGADERM 4X4.75 (GAUZE/BANDAGES/DRESSINGS) ×2 IMPLANT
ELECT PENCIL ROCKER SW 15FT (MISCELLANEOUS) ×2 IMPLANT
ELECT REM PT RETURN 15FT ADLT (MISCELLANEOUS) ×2 IMPLANT
EVACUATOR SILICONE 100CC (DRAIN) ×2 IMPLANT
GAUZE SPONGE 2X2 8PLY STRL LF (GAUZE/BANDAGES/DRESSINGS) ×1 IMPLANT
GLOVE SRG 8 PF TXTR STRL LF DI (GLOVE) ×2 IMPLANT
GLOVE SURG ENC MOIS LTX SZ6.5 (GLOVE) ×2 IMPLANT
GLOVE SURG ENC TEXT LTX SZ7.5 (GLOVE) ×4 IMPLANT
GLOVE SURG UNDER POLY LF SZ8 (GLOVE) ×2
GOWN STRL REUS W/TWL LRG LVL3 (GOWN DISPOSABLE) ×2 IMPLANT
GOWN STRL REUS W/TWL XL LVL3 (GOWN DISPOSABLE) ×4 IMPLANT
HEMOSTAT SURGICEL 4X8 (HEMOSTASIS) ×2 IMPLANT
HOLDER FOLEY CATH W/STRAP (MISCELLANEOUS) ×2 IMPLANT
IRRIG SUCT STRYKERFLOW 2 WTIP (MISCELLANEOUS) ×4
IRRIGATION SUCT STRKRFLW 2 WTP (MISCELLANEOUS) ×2 IMPLANT
KIT BASIN OR (CUSTOM PROCEDURE TRAY) ×2 IMPLANT
KIT TURNOVER KIT A (KITS) IMPLANT
LOOP VESSEL MAXI BLUE (MISCELLANEOUS) IMPLANT
MARKER SKIN DUAL TIP RULER LAB (MISCELLANEOUS) ×2 IMPLANT
NEEDLE INSUFFLATION 14GA 120MM (NEEDLE) ×2 IMPLANT
POUCH SPECIMEN RETRIEVAL 10MM (ENDOMECHANICALS) ×2 IMPLANT
PROTECTOR NERVE ULNAR (MISCELLANEOUS) ×4 IMPLANT
SEAL CANN UNIV 5-8 DVNC XI (MISCELLANEOUS) ×4 IMPLANT
SEAL XI 5MM-8MM UNIVERSAL (MISCELLANEOUS) ×4
SEALANT SURGICAL APPL DUAL CAN (MISCELLANEOUS) IMPLANT
SET TUBE SMOKE EVAC HIGH FLOW (TUBING) ×2 IMPLANT
SOLUTION ELECTROLUBE (MISCELLANEOUS) ×2 IMPLANT
SPONGE GAUZE 2X2 8PLY STRL LF (GAUZE/BANDAGES/DRESSINGS) ×2 IMPLANT
SPONGE GAUZE 2X2 STER 10/PKG (GAUZE/BANDAGES/DRESSINGS) ×1
STAPLE RELOAD 45 WHT (STAPLE) IMPLANT
STAPLE RELOAD 45MM WHITE (STAPLE)
SUT ETHILON 2 0 PSLX (SUTURE) ×2 IMPLANT
SUT MNCRL AB 4-0 PS2 18 (SUTURE) ×4 IMPLANT
SUT PDS AB 0 CT1 36 (SUTURE) IMPLANT
SUT V-LOC BARB 180 2/0GR6 GS22 (SUTURE)
SUT VIC AB 1 CT1 27 (SUTURE) ×5
SUT VIC AB 1 CT1 27XBRD ANTBC (SUTURE) ×5 IMPLANT
SUT VICRYL 0 UR6 27IN ABS (SUTURE) ×4 IMPLANT
SUT VLOC BARB 180 ABS3/0GR12 (SUTURE) ×12
SUTURE V-LC BRB 180 2/0GR6GS22 (SUTURE) IMPLANT
SUTURE VLOC BRB 180 ABS3/0GR12 (SUTURE) ×6 IMPLANT
TOWEL OR 17X26 10 PK STRL BLUE (TOWEL DISPOSABLE) ×2 IMPLANT
TOWEL OR NON WOVEN STRL DISP B (DISPOSABLE) ×2 IMPLANT
TRAY FOLEY MTR SLVR 16FR STAT (SET/KITS/TRAYS/PACK) ×2 IMPLANT
TRAY LAPAROSCOPIC (CUSTOM PROCEDURE TRAY) ×2 IMPLANT
TROCAR BLADELESS OPT 5 100 (ENDOMECHANICALS) ×2 IMPLANT
TROCAR UNIVERSAL OPT 12M 100M (ENDOMECHANICALS) ×2 IMPLANT
TROCAR XCEL 12X100 BLDLESS (ENDOMECHANICALS) ×2 IMPLANT
WATER STERILE IRR 1000ML POUR (IV SOLUTION) ×2 IMPLANT

## 2021-01-24 NOTE — H&P (Signed)
Office Visit Report     01/14/2021   --------------------------------------------------------------------------------   Terrence Santiago  MRN: [id number]  DOB: May 11, 1981, 40 year old Male  PRIMARY CARE:    REFERRING:  Hetty Blend, NP  PROVIDER:  Kasandra Knudsen, M.D.  TREATING:  Ulyses Amor, [place]  LOCATION:  [facility] [phone]     --------------------------------------------------------------------------------   CC/HPI: Pt presents today for pre-operative history and physical exam in anticipation of robotic assisted left lap partial nephrectomy by Dr. Liliane Shi on 01/24/21. He is doing well and is without complaint.   Pt denies F/C, HA, CP, SOB, N/V, diarrhea/constipation, back pain, flank pain, hematuria, and dysuria.    HX:   Left renal pelvis diverticulum   Mr. Kappes is a 40 year old male, referred by Dr. 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   10/03/20: The patient is here today to discuss surgery to remove his left sided calyceal diverticulum. He notes several stone episodes over the past year, but has never required surgical intervention. No prior abdominal surgeries. Denies interval episodes of flank pain, dysuria or hematuria. No personal/family history of GU malignancies. 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 - 10/03/2020, Reviewed patient's CT scan personally as well as showed patient the images. It appears that he has a communicating diverticulum with communication to the upper pole calyx and numerous stones seen within the  diverticulum. Based on anatomy he may likely need a robotic diverticulectomy/partial nephrectomy. I discussed how this procedures performed as well as postoperative outcome a need for potential ureteral stent. We discussed 1-2 night stay in the hospital as well as a minimum of 2 weeks off of work. 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   Tobacco Use Assessment Completed: Used Tobacco in last 30 days? Has never drank.  Does not use drugs. Drinks 2 caffeinated drinks per day. Has not had a blood transfusion.    REVIEW OF SYSTEMS:    GU Review Male:   Patient denies trouble starting your stream, hard to postpone urination, erection problems, get up at night to urinate, stream starts and stops, burning/ pain with urination, penile pain, have to strain to urinate , leakage of urine, and frequent urination.  Gastrointestinal (Upper):   Patient denies nausea, vomiting, and indigestion/ heartburn.  Gastrointestinal (Lower):   Patient denies diarrhea and constipation.  Constitutional:   Patient denies fever, night sweats, weight loss, and fatigue.  Skin:   Patient denies skin rash/ lesion and itching.  Eyes:   Patient denies blurred vision  and double vision.  Ears/ Nose/ Throat:   Patient denies sore throat and sinus problems.  Hematologic/Lymphatic:   Patient denies swollen glands and easy bruising.   Cardiovascular:   Patient denies leg swelling and chest pains.  Respiratory:   Patient denies cough and shortness of breath.  Endocrine:   Patient denies excessive thirst.  Musculoskeletal:   Patient denies back pain and joint pain.  Neurological:   Patient denies headaches and dizziness.  Psychologic:   Patient denies depression and anxiety.   VITAL SIGNS:      01/14/2021 01:48 PM  Weight 185 lb / 83.91 kg  Height 69 in / 175.26 cm  BP 143/82 mmHg  Pulse 94 /min  Temperature 98.7 F / 37.0 C  BMI 27.3 kg/m   MULTI-SYSTEM PHYSICAL EXAMINATION:    Constitutional: Well-nourished. No physical deformities. Normally developed. Good grooming.  Neck: Neck symmetrical, not swollen. Normal tracheal position.  Respiratory: Normal breath sounds. No labored breathing, no use of accessory muscles.   Cardiovascular: Regular rate and rhythm. No murmur, no gallop.   Lymphatic: No enlargement of neck, axillae, groin.  Skin: No paleness, no jaundice, no cyanosis. No lesion, no ulcer, no rash.  Neurologic / Psychiatric: Oriented to time, oriented to place, oriented to person. No depression, no anxiety, no agitation.  Gastrointestinal: No mass, no tenderness, no rigidity, non obese abdomen.  Eyes: Normal conjunctivae. Normal eyelids.  Ears, Nose, Mouth, and Throat: Left ear no scars, no lesions, no masses. Right ear no scars, no lesions, no masses. Nose no scars, no lesions, no masses. Normal hearing. Normal lips.  Musculoskeletal: Normal gait and station of head and neck.     Complexity of Data:     CLINICAL DATA:  Left renal mass on prior stone study. Left flank  pain. History of renal stones.     EXAM:  CT ABDOMEN AND PELVIS WITHOUT AND WITH CONTRAST     TECHNIQUE:  Multidetector CT imaging of the abdomen and pelvis was performed  following the standard protocol before and following the bolus  administration of intravenous contrast.     CONTRAST:  150 cc of Omnipaque 300      COMPARISON:  08/08/2020     FINDINGS:  Lower chest: 3 mm anterior right lower lobe pulmonary nodule on  01/03 is most likely a subpleural lymph node.     Normal heart size without pericardial or pleural effusion.     Hepatobiliary: Possible vague segment 5 subcentimeter too small to  characterize liver lesion on 19/5. Focal steatosis adjacent the  falciform ligament. Normal gallbladder, without biliary ductal  dilatation.     Pancreas: Normal, without mass or ductal dilatation.     Spleen: Normal in size, without focal abnormality.     Adrenals/Urinary Tract: Normal adrenal glands. Left renal collecting  system calculi of up to 4 mm. No hydronephrosis. No hydroureter or  ureteric calculi. A 3 mm left bladder base stone is new since the  prior stone study of 08/08/2020.     Exophytic lesion off the upper pole left kidney measures 5.3 x 5.6  cm on 16/5. Demonstrates communication with the collecting system on  delayed images (18/8) consistent with a calyceal diverticulum.  Multiple dependent stones within.     No suspicious renal mass. Good renal collecting system opacification  on delayed images. Good ureteric opacification, without filling  defect. No enhancing bladder mass or filling defect on delayed  images.     Stomach/Bowel: Normal stomach, without wall  thickening. Colonic  stool burden suggests constipation. Normal terminal ileum. Normal  small bowel.     Vascular/Lymphatic: Normal caliber of the aorta and branch vessels.  No abdominopelvic adenopathy.     Reproductive: Normal prostate.     Other: No significant free fluid.     Musculoskeletal: Transitional S1 vertebral body.     IMPRESSION:  1. Left nephrolithiasis, without obstructive uropathy.  2. 3 mm left bladder base stone, new since 08/08/2020.  3. Left renal dominant calyceal diverticulum with multiple dependent  stones within.  4. Possible constipation.  5. 3 mm right lower lobe pulmonary nodule is  most likely a  subpleural lymph node. No follow-up needed if patient is low-risk.  Non-contrast chest CT can be considered in 12 months if patient is  high-risk. This recommendation follows the consensus statement:  Guidelines for Management of Incidental Pulmonary Nodules Detected  on CT Images: From the Fleischner Society 2017; Radiology 2017;  284:228-243.        Electronically Signed    By: Jeronimo Greaves M.D.    On: 09/18/2020 13:59      Records Review:   Previous Patient Records  Urine Test Review:   Urinalysis   01/14/21  Urinalysis  Urine Appearance Clear   Urine Color Yellow   Urine Glucose Neg mg/dL  Urine Bilirubin Neg mg/dL  Urine Ketones Neg mg/dL  Urine Specific Gravity 1.015   Urine Blood Neg ery/uL  Urine pH 6.5   Urine Protein Neg mg/dL  Urine Urobilinogen 0.2 mg/dL  Urine Nitrites Neg   Urine Leukocyte Esterase Neg leu/uL   PROCEDURES:          Urinalysis - 81003 Dipstick Dipstick Cont'd  Color: Yellow Bilirubin: Neg mg/dL  Appearance: Clear Ketones: Neg mg/dL  Specific Gravity: 6.073 Blood: Neg ery/uL  pH: 6.5 Protein: Neg mg/dL  Glucose: Neg mg/dL Urobilinogen: 0.2 mg/dL    Nitrites: Neg    Leukocyte Esterase: Neg leu/uL    ASSESSMENT:      ICD-10 Details  1 GU:   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 The risks of LEFT robotic partial nephrectomy were discussed in detail including but not limited to: open conversion, completion nephrectomy, infection of the urinary tract/skin/abdominal cavity, VTE, MI/CVA, lymphatic leak, injury to adjacent solid/hollow viscus organs, bleeding requiring a blood transfusion, catastrophic bleeding, hernia formation, need for  postoperative angioembolization, urinary leak requiring stent/drain, and other imponderables. He voices understanding and wishes to proceed.

## 2021-01-24 NOTE — Discharge Instructions (Signed)

## 2021-01-24 NOTE — Anesthesia Postprocedure Evaluation (Signed)
Anesthesia Post Note  Patient: Terrence Santiago  Procedure(s) Performed: XI ROBOTIC ASSITED PARTIAL NEPHRECTOMY (Left )     Patient location during evaluation: PACU Anesthesia Type: General Level of consciousness: awake Pain management: pain level controlled Vital Signs Assessment: post-procedure vital signs reviewed and stable Respiratory status: spontaneous breathing Cardiovascular status: stable Postop Assessment: no apparent nausea or vomiting Anesthetic complications: no   No complications documented.  Last Vitals:  Vitals:   01/24/21 1700 01/24/21 1715  BP: (!) 173/106 (!) 165/106  Pulse: 91 90  Resp: 17 14  Temp:    SpO2: 100% 100%    Last Pain:  Vitals:   01/24/21 1715  TempSrc:   PainSc: Asleep                 Caren Macadam

## 2021-01-24 NOTE — Transfer of Care (Signed)
Immediate Anesthesia Transfer of Care Note  Patient: Kutter Schnepf  Procedure(s) Performed: XI ROBOTIC ASSITED PARTIAL NEPHRECTOMY (Left )  Patient Location: PACU  Anesthesia Type:General  Level of Consciousness: awake, alert  and oriented  Airway & Oxygen Therapy: Patient Spontanous Breathing and Patient connected to face mask oxygen  Post-op Assessment: Report given to RN, Post -op Vital signs reviewed and stable and Patient moving all extremities X 4  Post vital signs: Reviewed and stable  Last Vitals:  Vitals Value Taken Time  BP    Temp    Pulse 99 01/24/21 1636  Resp 14 01/24/21 1636  SpO2 100 % 01/24/21 1636  Vitals shown include unvalidated device data.  Last Pain:  Vitals:   01/24/21 1126  TempSrc:   PainSc: 0-No pain         Complications: No complications documented.

## 2021-01-24 NOTE — Plan of Care (Signed)

## 2021-01-24 NOTE — Progress Notes (Signed)
Patient arrived to unit at 1835.  Vitals taken and patient settled in room.  Bed alarm on and call bell within reach.

## 2021-01-24 NOTE — Op Note (Signed)
Operative Note  Preoperative diagnosis:  1.  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  Robot-assisted laparoscopic left partial nephrectomy  Surgeon: Rhoderick Moody, MD  Assistants: Harrie Foreman, PA-C An assistant was required for this surgical procedure.  The duties of the assistant included but were not limited to suctioning, passing suture, camera manipulation, retraction.  This procedure would not be able to be performed without an Geophysicist/field seismologist.   Anesthesia:  General  Complications:  None  EBL:  200 mL  Specimens: 1.  Left renal cystic lesion with numerous stones  Drains/Catheters: 1.  Left lower quadrant JP drain 2.  Foley catheter  Intraoperative findings:   1. Left upper pole collecting system diverticulum with approximately 100 small kidney stones within it.  The diverticulum communicated with the left renal collecting system that was eventually closed in 2 layers that showed no evidence of a urine leak and was hemostatic at the conclusion of the case.  Indication:  Terrence Santiago is a 40 y.o. 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  He has been consented for the above procedures, voices understanding and wishes to proceed.  Description of procedure:  After informed consent was obtained, the patient was brought to the operating room and general endotracheal anesthesia was administered.  The patient was then placed in the right lateral decubitus position and prepped and draped in usual sterile fashion.  A timeout was performed.  An 8 mm incision was then made lateral to the left rectus muscle at the level of the left 12th rib.  Abdominal access was obtained via a Veress needle.  The abdominal cavity was then insufflated up to 15 mmHg.  An 8 mm port was then introduced into the abdominal cavity.  Inspection of the port entry site by the robotic camera  revealed no adjacent organ injury.  We then placed 3 additional 8 mm robotic ports to triangulate the left renal hilum.  A 12 mm assistant port was then placed between the carmera port and 3rd robotic arm.  The white line of Toldt along the descending colon was incised sharply and the colon, along with its mesocolonic fat, was reflected medially until the aorta was identified.  We then made a small window adjacent to the lower pole of the left kidney, identifying the left psoas muscle, left ureter and left gonadal vein.  The left ureter and gonadal vein were then reflected anteriorly allowing [place] to then incised the perihilar attachments using electrocautery.  We encountered a small lumbar vein adjacent to the insertion of the left gonadal vein into the left renal vein.  This lumbar vein was ligated with hemo-lock clips in 2 places and incised sharply.  This provided [place] excellent exposure to the left renal hilum.  The perilymphatic tissue surrounding the left renal artery was carefully dissected away, creating a window to place a bulldog clamp later in the procedure.  The anterior portion of Gerota's fascia was incised, allowing reflection the perinephric fat medially and laterally until there was adequate exposure of the left upper pole cystic lesion.  The left upper pole cystic lesion was mobilized circumferentially and then excised about its base.  Numerous stones were seen within the lumen of the cyst and were suctioned out.  The cystic lesion was then placed in an Endo Catch bag and retrieved at the conclusion of the case.  The margins of the remaining cyst/collecting  system were then reapproximated using a series of 3-0 V-Loc sutures in a running fashion.  The renal capsule was then reapproximated using interrupted 1-0 Vicryl sutures that were bolstered with hemolock clips and Lapra-Ty's.  The renal defect was hemostatic and did not appear to be leaking any urine at the conclusion of the renorrhaphy.  The  incised Gerota's fascia overlying the mass was then reapproximated using a running 2-0 V lock suture.  A JP drain was then placed in the left lower quadrant incision adjacent to the lower pole of the left kidney.  The robot was then de-docked and the camera was then reinserted into the assistant port. Laparoscopic graspers were then used to grab the string of the Endo Catch bag, which was brought out through the 12 mm assistant port.  The abdomen was then desufflated and all ports were removed.  The assistant port incision was then extended approximately 1 cm and the left renal mass, within the Endo Catch bag, was removed and sent to pathology for permanent section.  The fascia within the assistant port incision was then reapproximated using a 0 Vicryl suture.  The remainder of the incisions were then closed using 4-0 Monocryl and dressed appropriately.  Patient tolerated the procedure well and was transferred to the postanesthesia unit in stable condition.  Plan: Monitor on the floor overnight.  Check JP creatinine on postop day 1 and as needed after that.

## 2021-01-24 NOTE — Anesthesia Procedure Notes (Signed)
Procedure Name: Intubation Date/Time: 01/24/2021 12:45 PM Performed by: Niel Hummer, CRNA Pre-anesthesia Checklist: Patient identified, Emergency Drugs available, Suction available and Patient being monitored Patient Re-evaluated:Patient Re-evaluated prior to induction Oxygen Delivery Method: Circle system utilized Preoxygenation: Pre-oxygenation with 100% oxygen Induction Type: IV induction Ventilation: Mask ventilation without difficulty and Oral airway inserted - appropriate to patient size Laryngoscope Size: Mac and 4 Grade View: Grade II Tube type: Oral Tube size: 7.5 mm Number of attempts: 1 Airway Equipment and Method: Stylet Placement Confirmation: ETT inserted through vocal cords under direct vision,  positive ETCO2 and breath sounds checked- equal and bilateral Secured at: 23 cm Tube secured with: Tape Dental Injury: Teeth and Oropharynx as per pre-operative assessment

## 2021-01-24 NOTE — Anesthesia Preprocedure Evaluation (Signed)
Anesthesia Evaluation  Patient identified by MRN, date of birth, ID band Patient awake    Reviewed: Allergy & Precautions, NPO status , Patient's Chart, lab work & pertinent test results  Airway Mallampati: I       Dental  (+) Missing, Poor Dentition,    Pulmonary neg pulmonary ROS,    Pulmonary exam normal        Cardiovascular negative cardio ROS Normal cardiovascular exam     Neuro/Psych negative neurological ROS  negative psych ROS   GI/Hepatic Neg liver ROS,   Endo/Other  negative endocrine ROS  Renal/GU   negative genitourinary   Musculoskeletal negative musculoskeletal ROS (+)   Abdominal Normal abdominal exam  (+)   Peds  Hematology negative hematology ROS (+)   Anesthesia Other Findings   Reproductive/Obstetrics                             Anesthesia Physical Anesthesia Plan  ASA: I  Anesthesia Plan: General   Post-op Pain Management:    Induction: Intravenous  PONV Risk Score and Plan: 4 or greater and Ondansetron, Dexamethasone and Midazolam  Airway Management Planned: Oral ETT  Additional Equipment: None  Intra-op Plan:   Post-operative Plan: Extubation in OR  Informed Consent: I have reviewed the patients History and Physical, chart, labs and discussed the procedure including the risks, benefits and alternatives for the proposed anesthesia with the patient or authorized representative who has indicated his/her understanding and acceptance.     Dental advisory given  Plan Discussed with: CRNA  Anesthesia Plan Comments:         Anesthesia Quick Evaluation

## 2021-01-25 ENCOUNTER — Encounter (HOSPITAL_COMMUNITY): Payer: Self-pay | Admitting: Urology

## 2021-01-25 DIAGNOSIS — N2889 Other specified disorders of kidney and ureter: Secondary | ICD-10-CM | POA: Diagnosis not present

## 2021-01-25 LAB — BASIC METABOLIC PANEL WITH GFR
Anion gap: 9 (ref 5–15)
BUN: 12 mg/dL (ref 6–20)
CO2: 27 mmol/L (ref 22–32)
Calcium: 8.9 mg/dL (ref 8.9–10.3)
Chloride: 99 mmol/L (ref 98–111)
Creatinine, Ser: 0.85 mg/dL (ref 0.61–1.24)
GFR, Estimated: >60 mL/min
Glucose, Bld: 149 mg/dL — ABNORMAL HIGH (ref 70–99)
Potassium: 5.4 mmol/L — ABNORMAL HIGH (ref 3.5–5.1)
Sodium: 135 mmol/L (ref 135–145)

## 2021-01-25 LAB — HEMOGLOBIN AND HEMATOCRIT, BLOOD
HCT: 45.8 % (ref 39.0–52.0)
Hemoglobin: 14.8 g/dL (ref 13.0–17.0)

## 2021-01-25 LAB — CREATININE, FLUID (PLEURAL, PERITONEAL, JP DRAINAGE): Creat, Fluid: 0.9 mg/dL

## 2021-01-25 MED ORDER — ACETAMINOPHEN 500 MG PO TABS
1000.0000 mg | ORAL_TABLET | Freq: Four times a day (QID) | ORAL | Status: DC
  Administered 2021-01-25: 1000 mg via ORAL
  Filled 2021-01-25: qty 2

## 2021-01-25 NOTE — Plan of Care (Signed)

## 2021-01-25 NOTE — Discharge Summary (Signed)
Date of admission: 01/24/2021  Date of discharge: 01/25/2021  Admission diagnosis: Left calyceal diverticulum   Discharge diagnosis: Left calyceal diverticulum  History and Physical: For full details, please see admission history and physical. Briefly, Terrence Santiago is a 40 y.o. gentleman withLeft calyceal diverticulum and recurrent nephrolithiasis.  After discussing management/treatment options, he elected to proceed with surgical treatment.  Hospital Course: Terrence Santiago was taken to the operating room on 01/24/2021 and underwent a robotic assisted laparoscopic left partial nephrectomy. He tolerated this procedure well and without complications. Postoperatively, he was able to be transferred to a regular hospital room following recovery from anesthesia.  He was able to begin ambulating the night of surgery. He remained hemodynamically stable overnight.  He had excellent urine output with appropriately minimal output from his pelvic drain. JP drain creatinine was 0.9 and was removed on POD #1.  Foley removed POD1 and he passed a void trial. He was transitioned to oral pain medication, tolerated a regular diet, and had met all discharge criteria and was able to be discharged home later on POD#1.  Laboratory values: Recent Labs    01/24/21 1709 01/25/21 0601  HGB 14.8 14.8  HCT 45.7 45.8    Disposition: Home  Discharge instruction: He was instructed to be ambulatory but to refrain from heavy lifting, strenuous activity, or driving.   Discharge medications:   Allergies as of 01/25/2021   No Known Allergies     Medication List    TAKE these medications   docusate sodium 100 MG capsule Commonly known as: COLACE Take 1 capsule (100 mg total) by mouth 2 (two) times daily.   HYDROcodone-acetaminophen 5-325 MG tablet Commonly known as: Norco Take 1-2 tablets by mouth every 6 (six) hours as needed for moderate pain.   promethazine 12.5 MG tablet Commonly known as: PHENERGAN Take 1 tablet  (12.5 mg total) by mouth every 4 (four) hours as needed for nausea or vomiting.   rosuvastatin 10 MG tablet Commonly known as: Crestor Take 1 tablet (10 mg total) by mouth daily.

## 2021-01-25 NOTE — Progress Notes (Signed)
JP and foley removed per order. Pt ambulated in the hall independently and urinated after foley removal. Discharge instructions given to pt. Verbalizes understanding. Waiting for his wife to arrive to discharge home.

## 2021-01-25 NOTE — Progress Notes (Addendum)
1 Day Post-Op Subjective: The patient is doing well this morning.  No nausea or vomiting. Pain is adequately controlled. Hungry for breakfast. No flatus yet. Foley removed, JP drain creatinine pending.  Objective: Vital signs in last 24 hours: Temp:  [97.4 F (36.3 C)-98.6 F (37 C)] 97.4 F (36.3 C) (02/05 0616) Pulse Rate:  [80-105] 80 (02/05 0616) Resp:  [12-20] 20 (02/05 0616) BP: (135-184)/(87-115) 135/87 (02/05 0616) SpO2:  [97 %-100 %] 97 % (02/05 0616) Weight:  [85.9 kg-87.8 kg] 87.8 kg (02/04 1838)  Intake/Output from previous day: 02/04 0701 - 02/05 0700 In: 3032.5 [P.O.:240; I.V.:2692.5; IV Piggyback:100] Out: 1100 [Urine:700; Drains:200; Blood:200] Intake/Output this shift: No intake/output data recorded.  Physical Exam:  General: Alert and oriented. CV: RRR Lungs: Clear bilaterally. GI: Soft, mildly distended. Incisions: Clean and dry. Extremities: Nontender, no erythema, no edema.  Lab Results: Recent Labs    01/24/21 1709 01/25/21 0601  HGB 14.8 14.8  HCT 45.7 45.8          Recent Labs    01/25/21 0601  CREATININE 0.85           Results for orders placed or performed during the hospital encounter of 01/24/21 (from the past 24 hour(s))  ABO/Rh     Status: None   Collection Time: 01/24/21 11:30 AM  Result Value Ref Range   ABO/RH(D)      O POS Performed at [facility], [address] [place], [place] [address]   Hemoglobin and hematocrit, blood     Status: None   Collection Time: 01/24/21  5:09 PM  Result Value Ref Range   Hemoglobin 14.8 13.0 - 17.0 g/dL   HCT 81.2 75.1 - 70.0 %  Basic metabolic panel     Status: Abnormal   Collection Time: 01/25/21  6:01 AM  Result Value Ref Range   Sodium 135 135 - 145 mmol/L   Potassium 5.4 (H) 3.5 - 5.1 mmol/L   Chloride 99 98 - 111 mmol/L   CO2 27 22 - 32 mmol/L   Glucose, Bld 149 (H) 70 - 99 mg/dL   BUN 12 6 - 20 mg/dL   Creatinine, Ser 1.74 0.61 - 1.24 mg/dL   Calcium 8.9  8.9 - 94.4 mg/dL   GFR, Estimated >96 >75 mL/min   Anion gap 9 5 - 15  Hemoglobin and hematocrit, blood     Status: None   Collection Time: 01/25/21  6:01 AM  Result Value Ref Range   Hemoglobin 14.8 13.0 - 17.0 g/dL   HCT 91.6 38.4 - 66.5 %    Assessment/Plan: POD# 1 s/p robotic partial nephrectomy.  1) Ambulate, Incentive spirometry 2) Advance diet as tolerated 3) Transition to oral pain medication 4) JP creatinine 5) D/C urethral catheter    LOS: 0 days   Thea Alken 01/25/2021, 7:26 AM  I performed a history and physical examination of the patient and discussed the patients management with the resident.  I reviewed the resident's note and agree with the documented findings and plan of care.

## 2021-01-27 ENCOUNTER — Telehealth: Payer: Self-pay | Admitting: Internal Medicine

## 2021-01-27 LAB — SURGICAL PATHOLOGY

## 2021-01-27 NOTE — Telephone Encounter (Signed)
Pt is following up with urology for reason he was in hospital, no transition of care is needed as he will not follow-up here

## 2021-02-04 ENCOUNTER — Encounter: Payer: Self-pay | Admitting: Medical

## 2021-02-04 ENCOUNTER — Other Ambulatory Visit: Payer: Self-pay

## 2021-02-04 ENCOUNTER — Ambulatory Visit: Payer: 59 | Admitting: Medical

## 2021-02-04 VITALS — BP 134/80 | HR 86 | Ht 69.0 in | Wt 183.8 lb

## 2021-02-04 DIAGNOSIS — Z131 Encounter for screening for diabetes mellitus: Secondary | ICD-10-CM

## 2021-02-04 DIAGNOSIS — R030 Elevated blood-pressure reading, without diagnosis of hypertension: Secondary | ICD-10-CM

## 2021-02-04 DIAGNOSIS — R7309 Other abnormal glucose: Secondary | ICD-10-CM | POA: Insufficient documentation

## 2021-02-04 DIAGNOSIS — E875 Hyperkalemia: Secondary | ICD-10-CM | POA: Diagnosis not present

## 2021-02-04 DIAGNOSIS — N2889 Other specified disorders of kidney and ureter: Secondary | ICD-10-CM

## 2021-02-04 LAB — BASIC METABOLIC PANEL
BUN/Creatinine Ratio: 13 (ref 9–20)
BUN: 10 mg/dL (ref 6–20)
CO2: 24 mmol/L (ref 20–29)
Calcium: 9.9 mg/dL (ref 8.7–10.2)
Chloride: 99 mmol/L (ref 96–106)
Creatinine, Ser: 0.78 mg/dL (ref 0.76–1.27)
GFR calc Af Amer: 132 mL/min/{1.73_m2} (ref >59)
GFR calc non Af Amer: 115 mL/min/{1.73_m2} (ref >59)
Glucose: 94 mg/dL (ref 65–99)
Potassium: 4.9 mmol/L (ref 3.5–5.2)
Sodium: 138 mmol/L (ref 134–144)

## 2021-02-04 LAB — HEMOGLOBIN A1C
Est. average glucose Bld gHb Est-mCnc: 117 mg/dL
Hgb A1c MFr Bld: 5.7 % — ABNORMAL HIGH (ref 4.8–5.6)

## 2021-02-04 NOTE — Progress Notes (Signed)
Subjective:  Terrence Santiago is a 40 y.o. male who presents for Chief Complaint  Patient presents with  . Follow-up    Glucose was elevated before recent surgery      Here for f/u on glucose.   Had recent kidney surgery.  Here to f/u on lab abnormality.    He is usually active exercising.  Wants recheck on BP and sugar.   He is still recovering from abdominal surgery from last week, otherwise in normal state of health prior to recent surgery  No other aggravating or relieving factors.    No other c/o.  Past Medical History:  Diagnosis Date  . 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 History of kidney stones   . Hyperlipidemia    Family History  Problem Relation Age of Onset  . Hypertension Father   . Heart disease Father 72       MI  . Cancer Maternal Uncle   . Diabetes Maternal Grandmother   . Stroke Neg Hx     The following portions of the patient's history were reviewed and updated as appropriate: allergies, current medications, past family history, past medical history, past social history, past surgical history and problem list.  ROS Otherwise as in subjective above    Objective: BP 134/80   Pulse 86   Ht 5\' 9"  (1.753 m)   Wt 183 lb 12.8 oz (83.4 kg)   SpO2 98%   BMI 27.14 kg/m    BP Readings from Last 3 Encounters:  02/04/21 134/80  01/25/21 135/87  01/23/21 138/90   Wt Readings from Last 3 Encounters:  02/04/21 183 lb 12.8 oz (83.4 kg)  01/24/21 193 lb 8 oz (87.8 kg)  01/23/21 189 lb 6.4 oz (85.9 kg)   General appearance: alert, no distress, well developed, well nourished Neck: supple, no lymphadenopathy, no thyromegaly, no masses Heart: RRR, normal S1, S2, no murmurs Lungs: CTA bilaterally, no wheezes, rhonchi, or rales Pulses: 2+ radial pulses, 2+ pedal pulses, normal cap refill Ext: no edema   Assessment: Encounter Diagnoses  Name Primary?  . Elevated glucose Yes  . Elevated blood-pressure reading without diagnosis of hypertension    . Hyperkalemia   . Screening for diabetes mellitus   . Calyceal diverticulum      Plan: I reviewed over labs in the chart record.  Basic metabolic from January 25 shows normal creatinine, blood sugar elevated at 103, CBC normal  Labs from January 25, 2021 shows creatinine normal, BUN normal, blood sugar 149, potassium elevated at 5.4, hemoglobin normal  Elevated BPs recently in 01/2021 during the time he was dealing with surgery and kidney abnormality.  Looking back he had normal BPs at physical 04/2020, other visits in 2020 and 2021 also with normal BPs.  I advised that given his prior few years physical data, he has not had issues with glucose or BP.  I suspect his recent BPs are transiently elevated given pain, stress, and surgery.  He may have not been fasting on recent labs.  He is fasting today  F/u pending labs, and BP recheck in a few weeks here with nurse.   Lauri was seen today for follow-up.  Diagnoses and all orders for this visit:  Elevated glucose -     Basic metabolic panel  Elevated blood-pressure reading without diagnosis of hypertension -     Basic metabolic panel  Hyperkalemia -     Basic metabolic panel  Screening for diabetes mellitus -  Hemoglobin A1c  Calyceal diverticulum    Follow up: pending labs

## 2021-02-18 ENCOUNTER — Other Ambulatory Visit: Payer: 59

## 2021-02-18 ENCOUNTER — Other Ambulatory Visit: Payer: Self-pay

## 2021-04-14 ENCOUNTER — Ambulatory Visit: Payer: 59 | Admitting: Medical

## 2021-04-14 ENCOUNTER — Encounter: Payer: Self-pay | Admitting: Medical

## 2021-04-14 ENCOUNTER — Other Ambulatory Visit: Payer: Self-pay

## 2021-04-14 VITALS — BP 138/92 | HR 84 | Ht 69.0 in | Wt 189.0 lb

## 2021-04-14 DIAGNOSIS — M2569 Stiffness of other specified joint, not elsewhere classified: Secondary | ICD-10-CM | POA: Insufficient documentation

## 2021-04-14 DIAGNOSIS — G8929 Other chronic pain: Secondary | ICD-10-CM | POA: Insufficient documentation

## 2021-04-14 DIAGNOSIS — T148XXA Other injury of unspecified body region, initial encounter: Secondary | ICD-10-CM | POA: Diagnosis not present

## 2021-04-14 DIAGNOSIS — R0789 Other chest pain: Secondary | ICD-10-CM | POA: Insufficient documentation

## 2021-04-14 DIAGNOSIS — M545 Low back pain, unspecified: Secondary | ICD-10-CM

## 2021-04-14 NOTE — Progress Notes (Signed)
Subjective:  Terrence Santiago is a 40 y.o. male who presents for Chief Complaint  Patient presents with  . Flank Pain    Right side pain for about 1-2 weeks.      Here for right side pain x 2 weeks, right upper abdomen/side pain.  Has been working out exercising, not sure if this is attributable.  Pain not related to eating.   No nausea, no vomiting, no diarrhea or constipation.  Has daily BM.  Turning a certain way can set it off.   No fever.  No blood in stool or urine.  Has chronic back pain, but no worse with this pain.   No arm or leg pains related to this.    Has tried massage to help, ibuprofen a few times.     Has intermittent low back pain.  Has ongoing pains on and off for years.  Lately has flared up a little bit.  No pains in the legs, no numbness tingling weakness in the legs.   No recent fall or injury   No other aggravating or relieving factors.  No other c/o.  Past Medical History:  Diagnosis Date  . 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 History of kidney stones   . Hyperlipidemia    Current Outpatient Medications on File Prior to Visit  Medication Sig Dispense Refill  . docusate sodium (COLACE) 100 MG capsule Take 1 capsule (100 mg total) by mouth 2 (two) times daily.    Marland Kitchen HYDROcodone-acetaminophen (NORCO) 5-325 MG tablet Take 1-2 tablets by mouth every 6 (six) hours as needed for moderate pain. 20 tablet 0  . promethazine (PHENERGAN) 12.5 MG tablet Take 1 tablet (12.5 mg total) by mouth every 4 (four) hours as needed for nausea or vomiting. 15 tablet 0  . rosuvastatin (CRESTOR) 10 MG tablet Take 1 tablet (10 mg total) by mouth daily. 90 tablet 3   No current facility-administered medications on file prior to visit.   Past Surgical History:  Procedure Laterality Date  . LACERATION REPAIR     left hand  . ROBOTIC ASSITED PARTIAL NEPHRECTOMY Left 01/24/2021   Procedure: XI ROBOTIC ASSITED PARTIAL NEPHRECTOMY;  Surgeon: Rene Paci, MD;  Location: [facility];  Service: Urology;  Laterality: Left;    The following portions of the patient's history were reviewed and updated as appropriate: allergies, current medications, past family history, past medical history, past social history, past surgical history and problem list.  ROS Otherwise as in subjective above    Objective: BP (!) 138/92   Pulse 84   Ht 5\' 9"  (1.753 m)   Wt 189 lb (85.7 kg)   SpO2 97%   BMI 27.91 kg/m   General appearance: alert, no distress, well developed, well nourished Neck: supple, no lymphadenopathy, no thyromegaly, no masses Tender over right lateral chest wall rib area oblique upper abdomen on the right tender, normal inspiration expiration Abdomen: +bs, soft, non tender, non distended, no masses, no hepatomegaly, no splenomegaly Back nontender but range of motion about 85% of normal with flexion and extension Arms and legs nontender with normal Pulses: 2+ radial pulses, 2+ pedal pulses, normal cap refill Ext: no edema   Assessment: Encounter Diagnoses  Name Primary?  . Chronic bilateral low back pain without sciatica Yes  . Right-sided chest wall pain   . Muscle strain   . Decreased ROM of trunk and back      Plan: We discussed symptoms and concerns  Right-sided chest  wall pain/muscle strain likely due to recent change in exercise routine.  Advise over-the-counter anti-inflammatory for the next week, continue heat, massage, stretching.  Avoid re injury, avoid heavy lifting or strenuous activity in the next week.  If not resolved within the next week and a half to let me know.  Chronic back pain, decreased range of motion of back- we discussed working on stretching and flexibility, continuing toning exercises as discussed such as core exercises, abdominal crunches, dead lifts, rows and other back strengthening exercises regularly except for this week to help resolve his side pain.  I reviewed his 2019 back x-ray that was unremarkable.  Offered  physical therapy referral but he declines for now.  He will work on home exercises as discussed.  If not improving consider physical therapy referral  Terrence Santiago was seen today for flank pain.  Diagnoses and all orders for this visit:  Chronic bilateral low back pain without sciatica  Right-sided chest wall pain  Muscle strain  Decreased ROM of trunk and back    Follow up: prn

## 2021-05-16 ENCOUNTER — Encounter: Payer: 59 | Admitting: Medical

## 2021-05-20 ENCOUNTER — Encounter: Payer: Self-pay | Admitting: Medical

## 2021-05-20 ENCOUNTER — Ambulatory Visit (INDEPENDENT_AMBULATORY_CARE_PROVIDER_SITE_OTHER): Payer: 59 | Admitting: Medical

## 2021-05-20 ENCOUNTER — Other Ambulatory Visit: Payer: Self-pay

## 2021-05-20 VITALS — BP 134/82 | HR 80 | Ht 69.0 in | Wt 186.2 lb

## 2021-05-20 DIAGNOSIS — Z7185 Encounter for immunization safety counseling: Secondary | ICD-10-CM | POA: Diagnosis not present

## 2021-05-20 DIAGNOSIS — Z8249 Family history of ischemic heart disease and other diseases of the circulatory system: Secondary | ICD-10-CM | POA: Diagnosis not present

## 2021-05-20 DIAGNOSIS — Z136 Encounter for screening for cardiovascular disorders: Secondary | ICD-10-CM | POA: Diagnosis not present

## 2021-05-20 DIAGNOSIS — N2889 Other specified disorders of kidney and ureter: Secondary | ICD-10-CM

## 2021-05-20 DIAGNOSIS — M545 Low back pain, unspecified: Secondary | ICD-10-CM

## 2021-05-20 DIAGNOSIS — G8929 Other chronic pain: Secondary | ICD-10-CM

## 2021-05-20 DIAGNOSIS — Z0000 Encounter for general adult medical examination without abnormal findings: Secondary | ICD-10-CM | POA: Diagnosis not present

## 2021-05-20 DIAGNOSIS — E785 Hyperlipidemia, unspecified: Secondary | ICD-10-CM

## 2021-05-20 LAB — COMPREHENSIVE METABOLIC PANEL
ALT: 67 IU/L — ABNORMAL HIGH (ref 0–44)
AST: 199 IU/L — ABNORMAL HIGH (ref 0–40)
Albumin/Globulin Ratio: 1.2 (ref 1.2–2.2)
Albumin: 4.5 g/dL (ref 4.0–5.0)
Alkaline Phosphatase: 106 IU/L (ref 44–121)
BUN/Creatinine Ratio: 9 (ref 9–20)
BUN: 9 mg/dL (ref 6–20)
Bilirubin Total: 0.9 mg/dL (ref 0.0–1.2)
CO2: 23 mmol/L (ref 20–29)
Calcium: 9.8 mg/dL (ref 8.7–10.2)
Chloride: 99 mmol/L (ref 96–106)
Creatinine, Ser: 1 mg/dL (ref 0.76–1.27)
Globulin, Total: 3.9 g/dL (ref 1.5–4.5)
Glucose: 94 mg/dL (ref 65–99)
Potassium: 4.8 mmol/L (ref 3.5–5.2)
Sodium: 141 mmol/L (ref 134–144)
Total Protein: 8.4 g/dL (ref 6.0–8.5)
eGFR: 98 mL/min/{1.73_m2} (ref >59)

## 2021-05-20 LAB — POCT URINALYSIS DIP (PROADVANTAGE DEVICE)
Bilirubin, UA: NEGATIVE
Blood, UA: NEGATIVE
Glucose, UA: NEGATIVE mg/dL
Ketones, POC UA: NEGATIVE mg/dL
Leukocytes, UA: NEGATIVE
Nitrite, UA: NEGATIVE
Protein Ur, POC: NEGATIVE mg/dL
Specific Gravity, Urine: 1.015
Urobilinogen, Ur: 0.2
pH, UA: 6.5 (ref 5.0–8.0)

## 2021-05-20 LAB — LIPID PANEL
Chol/HDL Ratio: 4.1 ratio (ref 0.0–5.0)
Cholesterol, Total: 128 mg/dL (ref 100–199)
HDL: 31 mg/dL — ABNORMAL LOW (ref >39)
LDL Chol Calc (NIH): 83 mg/dL (ref 0–99)
Triglycerides: 68 mg/dL (ref 0–149)
VLDL Cholesterol Cal: 14 mg/dL (ref 5–40)

## 2021-05-20 LAB — CBC WITH DIFFERENTIAL/PLATELET
Basophils Absolute: 0.1 10*3/uL (ref 0.0–0.2)
Basos: 1 %
EOS (ABSOLUTE): 0.2 10*3/uL (ref 0.0–0.4)
Eos: 2 %
Hematocrit: 44.8 % (ref 37.5–51.0)
Hemoglobin: 14.4 g/dL (ref 13.0–17.7)
Immature Grans (Abs): 0 10*3/uL (ref 0.0–0.1)
Immature Granulocytes: 0 %
Lymphocytes Absolute: 3.1 10*3/uL (ref 0.7–3.1)
Lymphs: 40 %
MCH: 27.7 pg (ref 26.6–33.0)
MCHC: 32.1 g/dL (ref 31.5–35.7)
MCV: 86 fL (ref 79–97)
Monocytes Absolute: 0.5 10*3/uL (ref 0.1–0.9)
Monocytes: 6 %
Neutrophils Absolute: 4 10*3/uL (ref 1.4–7.0)
Neutrophils: 51 %
Platelets: 389 10*3/uL (ref 150–450)
RBC: 5.2 x10E6/uL (ref 4.14–5.80)
RDW: 12.4 % (ref 11.6–15.4)
WBC: 7.8 10*3/uL (ref 3.4–10.8)

## 2021-05-20 NOTE — Progress Notes (Signed)
Subjective:   HPI  Terrence Santiago is a 40 y.o. male who presents for Chief Complaint  Patient presents with  . Annual Exam    Physical with fasting labs     Patient Care Team: Latanza Pfefferkorn, Kermit Balo, PA-C as PCP - General (Family Medicine) Sees dentist Sees eye doctor  Concerns: Been having some pains in the right hip and on the right side.  He does exercise but does not stretch very often.  No fall injury or trauma.   Reviewed their medical, surgical, family, social, medication, and allergy history and updated chart as appropriate.  Past Medical History:  Diagnosis Date  . 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 Gall stone 2022   per 01/2021 CT scan  . History of kidney stones   . Hyperlipidemia     Past Surgical History:  Procedure Laterality Date  . LACERATION REPAIR     left hand  . ROBOTIC ASSITED PARTIAL NEPHRECTOMY Left 01/24/2021   Procedure: XI ROBOTIC ASSITED PARTIAL NEPHRECTOMY;  Surgeon: Rene Paci, MD;  Location: [facility];  Service: Urology;  Laterality: Left;    Family History  Problem Relation Age of Onset  . Hypertension Father   . Heart disease Father 51       MI  . Cancer Maternal Uncle   . Diabetes Maternal Grandmother   . Stroke Neg Hx      Current Outpatient Medications:  .  rosuvastatin (CRESTOR) 10 MG tablet, Take 1 tablet (10 mg total) by mouth daily., Disp: 90 tablet, Rfl: 3  No Known Allergies   Review of Systems Constitutional: -fever, -chills, -sweats, -unexpected weight change, -decreased appetite, -fatigue Allergy: -sneezing, -itching, -congestion Dermatology: -changing moles, --rash, -lumps ENT: -runny nose, -ear pain, -sore throat, -hoarseness, -sinus pain, -teeth pain, - ringing in ears, -hearing loss, -nosebleeds Cardiology: -chest pain, -palpitations, -swelling, -difficulty breathing when lying flat, -waking up short of breath Respiratory: -cough, -shortness of breath, -difficulty breathing with exercise or  exertion, -wheezing, -coughing up blood Gastroenterology: -abdominal pain, -nausea, -vomiting, -diarrhea, -constipation, -blood in stool, -changes in bowel movement, -difficulty swallowing or eating Hematology: -bleeding, -bruising  Musculoskeletal: -joint aches, -muscle aches, -joint swelling, -back pain, -neck pain, -cramping, -changes in gait Ophthalmology: denies vision changes, eye redness, itching, discharge Urology: -burning with urination, -difficulty urinating, -blood in urine, -urinary frequency, -urgency, -incontinence Neurology: -headache, -weakness, -tingling, -numbness, -memory loss, -falls, -dizziness Psychology: -depressed mood, -agitation, -sleep problems Male GU: no testicular mass, pain, no lymph nodes swollen, no swelling, no rash.  Depression screen [facility] 2/9 05/20/2021 05/13/2020 11/23/2018 04/21/2017  Decreased Interest 0 0 0 0  Down, Depressed, Hopeless 0 0 0 0  PHQ - 2 Score 0 0 0 0        Objective:  BP 134/82   Pulse 80   Ht 5\' 9"  (1.753 m)   Wt 186 lb 3.2 oz (84.5 kg)   SpO2 97%   BMI 27.50 kg/m   General appearance: alert, no distress, WD/WN, African American male Skin: Scattered macules, several small brown flat macules of back throughout suggestive of early seborrheic keratoses HEENT: normocephalic, conjunctiva/corneas normal, sclerae anicteric, PERRLA, EOMi, nares patent, no discharge or erythema, pharynx normal Oral cavity: MMM, tongue normal, teeth normal Neck: supple, no lymphadenopathy, no thyromegaly, no masses, normal ROM, no bruits Chest: non tender, normal shape and expansion Heart: RRR, normal S1, S2, no murmurs Lungs: CTA bilaterally, no wheezes, rhonchi, or rales Abdomen: +bs, soft, several port surgical scars noted, otherwise non  tender, non distended, no masses, no hepatomegaly, no splenomegaly, no bruits Back: non tender, normal ROM, no scoliosis Musculoskeletal: Nontender to palpation but slightly reduced hip range of motion in general with  some pain noted on the right side with external rotation, otherwise nontender, upper extremities non tender, no obvious deformity, normal ROM throughout, lower extremities non tender, no obvious deformity, normal ROM throughout Extremities: no edema, no cyanosis, no clubbing Pulses: 2+ symmetric, upper and lower extremities, normal cap refill Neurological: alert, oriented x 3, CN2-12 intact, strength normal upper extremities and lower extremities, sensation normal throughout, DTRs 2+ throughout, no cerebellar signs, gait normal Psychiatric: normal affect, behavior normal, pleasant  GU: normal male external genitalia,circumcised, nontender, no masses, no hernia, no lymphadenopathy Rectal: deferred  EKG: Indication premature family history of CAD, screen for heart disease, rate 67 bpm, PR interval 184 ms, QRS 82 ms, QTC 405 ms, axis 75 degrees, normal sinus rhythm   Assessment and Plan :   Encounter Diagnoses  Name Primary?  . Encounter for health maintenance examination in adult Yes  . Family history of premature CAD   . Vaccine counseling   . Calyceal diverticulum   . Chronic bilateral low back pain without sciatica   . Dyslipidemia, goal LDL below 130   . Screening for heart disease     This visit was a preventative care visit, also known as wellness visit or routine physical.   Topics typically include healthy lifestyle, diet, exercise, preventative care, vaccinations, sick and well care, proper use of emergency dept and after hours care, as well as other concerns.     Recommendations: Continue to return yearly for your annual wellness and preventative care visits.  This gives [place] a chance to discuss healthy lifestyle, exercise, vaccinations, review your chart record, and perform screenings where appropriate.  I recommend you see your eye doctor yearly for routine vision care.  I recommend you see your dentist yearly for routine dental care including hygiene visits twice  yearly.   Vaccination recommendations were reviewed Immunization History  Administered Date(s) Administered  . Influenza,inj,Quad PF,6+ Mos 11/22/2014  . PFIZER(Purple Top)SARS-COV-2 Vaccination 01/10/2020, 01/31/2020  . Tdap 05/13/2020   Advised yearly flu shot   Screening for cancer: Colon cancer screening: Age 80  Testicular cancer screening You should do a monthly self testicular exam if you are between 81-8 years old  We discussed PSA, prostate exam, and prostate cancer screening risks/benefits.   Age 19  Skin cancer screening: Check your skin regularly for new changes, growing lesions, or other lesions of concern Come in for evaluation if you have skin lesions of concern.  Lung cancer screening: If you have a greater than 20 pack year history of tobacco use, then you may qualify for lung cancer screening with a chest CT scan.   Please call your insurance company to inquire about coverage for this test.  We currently don't have screenings for other cancers besides breast, cervical, colon, and lung cancers.  If you have a strong family history of cancer or have other cancer screening concerns, please let me know.    Bone health: Get at least 150 minutes of aerobic exercise weekly Get weight bearing exercise at least once weekly Bone density test:   A bone density test is an imaging test that uses a type of X-ray to measure the amount of calcium and other minerals in your bones.  The test may be used to diagnose or screen you for a condition that causes weak  or thin bones (osteoporosis), predict your risk for a broken bone (fracture), or determine how well your osteoporosis treatment is working. The bone density test is recommended for females 65 and older, or females or males <65 if certain risk factors such as thyroid disease, long term use of steroids such as for asthma or rheumatological issues, vitamin D deficiency, estrogen deficiency, family history of osteoporosis,  self or family history of fragility fracture in first degree relative.    Heart health: Get at least 150 minutes of aerobic exercise weekly Limit alcohol It is important to maintain a healthy blood pressure and healthy cholesterol numbers  Heart disease screening: Screening for heart disease includes screening for blood pressure, fasting lipids, glucose/diabetes screening, BMI height to weight ratio, reviewed of smoking status, physical activity, and diet.    Goals include blood pressure 120/80 or less, maintaining a healthy lipid/cholesterol profile, preventing diabetes or keeping diabetes numbers under good control, not smoking or using tobacco products, exercising most days per week or at least 150 minutes per week of exercise, and eating healthy variety of fruits and vegetables, healthy oils, and avoiding unhealthy food choices like fried food, fast food, high sugar and high cholesterol foods.    Other tests may possibly include EKG test, CT coronary calcium score, echocardiogram, exercise treadmill stress test.   EKG reviewed baseline today    Medical care options: I recommend you continue to seek care here first for routine care.  We try really hard to have available appointments Monday through Friday daytime hours for sick visits, acute visits, and physicals.  Urgent care should be used for after hours and weekends for significant issues that cannot wait till the next day.  The emergency department should be used for significant potentially life-threatening emergencies.  The emergency department is expensive, can often have long wait times for less significant concerns, so try to utilize primary care, urgent care, or telemedicine when possible to avoid unnecessary trips to the emergency department.  Virtual visits and telemedicine have been introduced since the pandemic started in 2020, and can be convenient ways to receive medical care.  We offer virtual appointments as well to assist  you in a variety of options to seek medical care.    Separate significant issues discussed:  Impaired fasting glucose on labs in February- discussed the need for healthy low sugar low-fat diet and regular exercise  Gallstones noted on CT scan August 2021.  We discussed symptoms of gallbladder disease.  He is asymptomatic  History of kidney abnormality, calyceal diverticulum with surgery earlier this year.  Currently doing okay without complaint  Hyperlipidemia, premature CAD in family history-labs today, continue statin, discussed healthy diet and regular exercise    Chawn was seen today for annual exam.  Diagnoses and all orders for this visit:  Encounter for health maintenance examination in adult -     Lipid panel -     CBC with Differential/Platelet -     Comprehensive metabolic panel -     EKG 12-Lead -     POCT Urinalysis DIP (Proadvantage Device)  Family history of premature CAD -     EKG 12-Lead  Vaccine counseling  Calyceal diverticulum  Chronic bilateral low back pain without sciatica  Dyslipidemia, goal LDL below 130 -     Lipid panel -     Comprehensive metabolic panel  Screening for heart disease -     EKG 12-Lead     Follow-up pending labs, yearly for physical

## 2021-05-22 ENCOUNTER — Telehealth: Payer: Self-pay

## 2021-05-22 NOTE — Telephone Encounter (Signed)
Yes I still recommend exercise, but avoid heavy lifting or heavy contact sports for now until he sees improvements  We are awaiting other labs as well  Terrence Santiago

## 2021-05-22 NOTE — Telephone Encounter (Signed)
Pt lmom wanting to know if he needs to stop exercise. Stated the pain he has when lying on his right side is obviously related to the inflammation seen on lab results. Patient stated he started taking a fat burner supplement about 1 month ago. Patient was informed to stop that supplement until we recheck labs in 1 month. Patient was advised that he could continue workouts unless he was having discomfort which he has denied any discomfort.

## 2021-05-23 LAB — ACUTE VIRAL HEPATITIS (HAV, HBV, HCV)
HCV Ab: <0.1 s/co ratio (ref 0.0–0.9)
Hep A IgM: NEGATIVE
Hep B C IgM: NEGATIVE
Hepatitis B Surface Ag: NEGATIVE

## 2021-05-23 LAB — IRON: Iron: 74 ug/dL (ref 38–169)

## 2021-05-23 LAB — HCV INTERPRETATION

## 2021-05-23 LAB — SPECIMEN STATUS REPORT

## 2021-05-23 NOTE — Telephone Encounter (Signed)
Pt aware.

## 2021-06-09 ENCOUNTER — Other Ambulatory Visit: Payer: Self-pay | Admitting: Medical

## 2021-06-25 ENCOUNTER — Ambulatory Visit: Payer: 59 | Admitting: Medical

## 2021-06-25 ENCOUNTER — Other Ambulatory Visit: Payer: Self-pay

## 2021-06-25 VITALS — BP 124/80 | HR 62 | Wt 178.8 lb

## 2021-06-25 DIAGNOSIS — Z8249 Family history of ischemic heart disease and other diseases of the circulatory system: Secondary | ICD-10-CM

## 2021-06-25 DIAGNOSIS — E785 Hyperlipidemia, unspecified: Secondary | ICD-10-CM

## 2021-06-25 DIAGNOSIS — R7989 Other specified abnormal findings of blood chemistry: Secondary | ICD-10-CM | POA: Diagnosis not present

## 2021-06-25 DIAGNOSIS — Z136 Encounter for screening for cardiovascular disorders: Secondary | ICD-10-CM

## 2021-06-25 NOTE — Progress Notes (Signed)
Subjective:  Terrence Santiago is a 40 y.o. male who presents for Chief Complaint  Patient presents with   Follow-up    Follow-up on liver enzymes     Year today to recheck on elevated liver test.  At his recent physical in May and liver tests were elevated.  At that time he was taking a fat burner.  He was having some unusual pains in his belly and chest wall while on the medication.  He stopped the medicine and has been fine since.  He is not drinking alcohol around the time of his liver test.  We had him stop his cholesterol medicine once we saw the elevated liver test.  He feels fine currently.  Exercising regularly.  Has lost some weight intentionally since last visit.  No other concerns  No other aggravating or relieving factors.    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  ROS Otherwise as in subjective above  Objective: BP 124/80   Pulse 62   Wt 178 lb 12.8 oz (81.1 kg)   BMI 26.40 kg/m   Wt Readings from Last 3 Encounters:  06/25/21 178 lb 12.8 oz (81.1 kg)  05/20/21 186 lb 3.2 oz (84.5 kg)  04/14/21 189 lb (85.7 kg)    General appearance: alert, no distress, well developed, well nourished Abdomen: +bs, soft, non tender, non distended, no masses, no hepatomegaly, no splenomegaly    Assessment: Encounter Diagnoses  Name Primary?   Elevated LFTs Yes   Family history of premature CAD    Screening for heart disease     Dyslipidemia, goal LDL below 130      Plan: Reviewing his labs from last visit his iron was normal, his hepatitis panel was negative for hepatitis infection.  He had a CT scan of his abdomen pelvis 2021 done that did not show abnormalities of the liver.  He does have history of gallstones.  We suspect the back burner exercise supplement he was taking last visit may have caused the bump in his elevated liver test.  He was also on statin at the time.  If labs are back to normal then we will have to decide whether to restart statin or not.  He does have family history significant.  We discussed possibly doing a CT calcium score for further evaluation as part of our decision whether to use statin or not.  He does exercise regularly and eats healthy.  He is a non-smoker  Sandro was seen today for follow-up.  Diagnoses and all orders for this visit:  Elevated LFTs -     Hepatic function panel  Family history of premature CAD  Screening for heart disease  Dyslipidemia, goal LDL below 130 -     Lipid panel   Follow up: pending labs

## 2021-06-26 LAB — LIPID PANEL
Chol/HDL Ratio: 5 ratio (ref 0.0–5.0)
Cholesterol, Total: 186 mg/dL (ref 100–199)
HDL: 37 mg/dL — ABNORMAL LOW (ref >39)
LDL Chol Calc (NIH): 140 mg/dL — ABNORMAL HIGH (ref 0–99)
Triglycerides: 46 mg/dL (ref 0–149)
VLDL Cholesterol Cal: 9 mg/dL (ref 5–40)

## 2021-06-26 LAB — HEPATIC FUNCTION PANEL
ALT: 19 IU/L (ref 0–44)
AST: 21 IU/L (ref 0–40)
Albumin: 4.6 g/dL (ref 4.0–5.0)
Alkaline Phosphatase: 113 IU/L (ref 44–121)
Bilirubin Total: 0.7 mg/dL (ref 0.0–1.2)
Bilirubin, Direct: 0.14 mg/dL (ref 0.00–0.40)
Total Protein: 8.4 g/dL (ref 6.0–8.5)

## 2021-08-14 ENCOUNTER — Other Ambulatory Visit: Payer: Self-pay | Admitting: Medical

## 2021-08-14 ENCOUNTER — Telehealth: Payer: Self-pay

## 2021-08-14 MED ORDER — ROSUVASTATIN CALCIUM 10 MG PO TABS: 10.0000 mg | ORAL_TABLET | Freq: Every day | ORAL | 3 refills | Status: DC

## 2021-08-14 NOTE — Telephone Encounter (Signed)
I do not see pt is on this medication. I see about restarting med if need be but do I need to send in med

## 2021-08-14 NOTE — Telephone Encounter (Signed)
Pt. Called for a refill on his Rosuvastatin to [facility] in [place] pt. Last apt 06/25/21.

## 2021-10-31 ENCOUNTER — Ambulatory Visit: Payer: 59 | Admitting: Medical

## 2021-11-07 ENCOUNTER — Ambulatory Visit: Payer: 59 | Admitting: Medical

## 2022-01-09 ENCOUNTER — Other Ambulatory Visit: Payer: Self-pay

## 2022-01-09 ENCOUNTER — Ambulatory Visit: Payer: 59 | Admitting: Medical

## 2022-01-09 VITALS — BP 120/68 | HR 69 | Temp 97.8°F | Wt 181.0 lb

## 2022-01-09 DIAGNOSIS — M5386 Other specified dorsopathies, lumbar region: Secondary | ICD-10-CM

## 2022-01-09 DIAGNOSIS — M6289 Other specified disorders of muscle: Secondary | ICD-10-CM | POA: Insufficient documentation

## 2022-01-09 DIAGNOSIS — J329 Chronic sinusitis, unspecified: Secondary | ICD-10-CM

## 2022-01-09 DIAGNOSIS — J029 Acute pharyngitis, unspecified: Secondary | ICD-10-CM

## 2022-01-09 DIAGNOSIS — M25552 Pain in left hip: Secondary | ICD-10-CM

## 2022-01-09 DIAGNOSIS — M25551 Pain in right hip: Secondary | ICD-10-CM

## 2022-01-09 DIAGNOSIS — M545 Low back pain, unspecified: Secondary | ICD-10-CM

## 2022-01-09 DIAGNOSIS — G8929 Other chronic pain: Secondary | ICD-10-CM

## 2022-01-09 MED ORDER — AMOXICILLIN 500 MG PO CAPS: 500.0000 mg | ORAL_CAPSULE | Freq: Three times a day (TID) | ORAL | 0 refills | Status: AC

## 2022-01-09 NOTE — Progress Notes (Signed)
Subjective:  Terrence Santiago is a 41 y.o. male who presents for Chief Complaint  Patient presents with   Sore Throat    Sore throat x 2 weeks. No other symptoms. Covid tests negative. Declines flu shot     Here today for 2 issues.  He has had a irritated throat, sore throat for about 2 weeks.  Worse at night.  He has felt some postnasal drainage.  Otherwise no recent illness.  No ear pain, no sinus pressure, no sneezing, no runny nose, no sick contacts.  He has maybe an occasional cough.  He does not feel real bad just irritation in the throat.  He has tried some allergy medicine, tried some cold medicine tried Mucinex tried Chloraseptic spray, salt water gargles but it just will not go away completely.  No fever chills or body aches.  He notes ongoing chronic issues with low back pain in both hips.  This has been an ongoing problem for over a year now but may be worse of late.  He feels like his range of motion is quite reduced.  Hurts in both hips.  No injury or trauma.  No fall.  He denies any other joint swelling or pain.  No hand or wrist involvement.  No family history of rheumatoid condition.  One of his grandmothers had some arthritis but no other significant rheumatoid arthritic condition in the family history.  No numbness or tingling in the legs.  No weakness of the legs.  Otherwise in normal state of health  No other aggravating or relieving factors.    No other c/o.   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 90 tablet 3   No current facility-administered medications on file prior to visit.    Family History  Problem Relation Age of Onset   Hypertension Father    Heart disease  Father 18       MI   Cancer Maternal Uncle    Diabetes Maternal Grandmother    Stroke Neg Hx      The following portions of the patient's history were reviewed and updated as appropriate: allergies, current medications, past family history, past medical history, past social history, past surgical history and problem list.  ROS Otherwise as in subjective above    Objective: BP 120/68    Pulse 69    Temp 97.8 F (36.6 C)    Wt 181 lb (82.1 kg)    BMI 26.73 kg/m   General appearance: alert, no distress, well developed, well nourished HEENT: normocephalic, sclerae anicteric, conjunctiva pink and moist, TMs pearly, nares with turbinate edema, mucoid discharge, positive erythema, pharynx with mild generalized erythema but no exudate, no tonsillar swelling Oral cavity: MMM, no lesions Neck: supple, shotty tender anterior nodes, no thyromegaly, no masses Heart: RRR, normal S1, S2, no murmurs Lungs: CTA bilaterally, no wheezes, rhonchi, or rales Abdomen: +bs, soft, non tender, non distended, no masses, no hepatomegaly, no splenomegaly Back nontender in general but range of motion only to about 90 degrees with pain noted.  No scoliosis MSK: Slightly decreased internal range of motion of both hips but otherwise normal range of motion,  nontender legs in general no swelling, no upper extremity deformity or swelling either Pulses: 2+ radial pulses, 2+ pedal pulses, normal cap refill Ext: no edema Neuro: Normal strength and sensation of upper and lower extremities, normal DTRs normal heel and toe walk.  He does seem to have a straight leg raise pain bilaterally but worse on the left leg    Assessment: Encounter Diagnoses  Name Primary?   Sore throat Yes   Purulent postnasal drainage    Chronic bilateral low back pain without sciatica    Bilateral hip pain    Decreased ROM of lumbar spine    Hamstring tightness      Plan: Sore throat, postnasal drainage, likely some sinus  congestion-advised Mucinex and/or Sudafed for the next several days, add on nasal saline and salt water gargles, can continue sore throat anesthetic spray.  Since the symptoms have lingered, can begin amoxicillin.  Chronic back pain, bilateral hip pain, decreased back range of motion We discussed possible causes.  He did have a back x-ray in 2019 it was normal.  He also had a CT abdomen pelvis and August 2021 without any obvious musculoskeletal deformity noted by the radiologist.  We will check some rheumatoid screening labs today.  Pending labs will likely pursue either x-rays of back and hips or referral to orthopedics   Takai was seen today for sore throat.  Diagnoses and all orders for this visit:  Sore throat  Purulent postnasal drainage  Chronic bilateral low back pain without sciatica -     CYCLIC CITRUL PEPTIDE ANTIBODY, IGG/IGA -     Rheumatoid factor -     ANA -     Sedimentation rate -     Uric acid  Bilateral hip pain -     CYCLIC CITRUL PEPTIDE ANTIBODY, IGG/IGA -     Rheumatoid factor -     ANA -     Sedimentation rate -     Uric acid  Decreased ROM of lumbar spine -     CYCLIC CITRUL PEPTIDE ANTIBODY, IGG/IGA -     Rheumatoid factor -     ANA -     Sedimentation rate -     Uric acid  Hamstring tightness  Other orders -     amoxicillin (AMOXIL) 500 MG capsule; Take 1 capsule (500 mg total) by mouth 3 (three) times daily for 10 days.    Follow up: pending labs

## 2022-01-11 LAB — SEDIMENTATION RATE: Sed Rate: 14 mm/hr (ref 0–15)

## 2022-01-11 LAB — URIC ACID: Uric Acid: 5 mg/dL (ref 3.8–8.4)

## 2022-01-11 LAB — RHEUMATOID FACTOR: Rheumatoid fact SerPl-aCnc: <10 IU/mL (ref <14.0)

## 2022-01-11 LAB — CYCLIC CITRUL PEPTIDE ANTIBODY, IGG/IGA: Cyclic Citrullin Peptide Ab: 8 units (ref 0–19)

## 2022-01-11 LAB — ANA: Anti Nuclear Antibody (ANA): POSITIVE — AB

## 2022-01-12 ENCOUNTER — Other Ambulatory Visit: Payer: Self-pay | Admitting: Medical

## 2022-01-12 ENCOUNTER — Telehealth: Payer: Self-pay | Admitting: Medical

## 2022-01-12 DIAGNOSIS — M25551 Pain in right hip: Secondary | ICD-10-CM

## 2022-01-12 DIAGNOSIS — M5386 Other specified dorsopathies, lumbar region: Secondary | ICD-10-CM

## 2022-01-12 DIAGNOSIS — M545 Low back pain, unspecified: Secondary | ICD-10-CM

## 2022-01-12 DIAGNOSIS — M25552 Pain in left hip: Secondary | ICD-10-CM

## 2022-01-12 DIAGNOSIS — G8929 Other chronic pain: Secondary | ICD-10-CM

## 2022-01-12 NOTE — Telephone Encounter (Signed)
Pt called and would like to go ahead with the xrays he read your messages

## 2022-01-13 ENCOUNTER — Encounter: Payer: Self-pay | Admitting: Medical

## 2022-01-13 ENCOUNTER — Other Ambulatory Visit: Payer: Self-pay | Admitting: Medical

## 2022-01-13 ENCOUNTER — Ambulatory Visit
Admission: RE | Admit: 2022-01-13 | Discharge: 2022-01-13 | Disposition: A | Discharge status: Home / Self Care | Payer: 59 | Source: Ambulatory Visit | Attending: Medical | Admitting: Medical

## 2022-01-13 ENCOUNTER — Other Ambulatory Visit: Payer: Self-pay

## 2022-01-13 DIAGNOSIS — M5386 Other specified dorsopathies, lumbar region: Secondary | ICD-10-CM

## 2022-01-13 DIAGNOSIS — M25551 Pain in right hip: Secondary | ICD-10-CM

## 2022-01-13 DIAGNOSIS — G8929 Other chronic pain: Secondary | ICD-10-CM

## 2022-01-13 DIAGNOSIS — M25552 Pain in left hip: Secondary | ICD-10-CM

## 2022-01-23 DIAGNOSIS — M5386 Other specified dorsopathies, lumbar region: Secondary | ICD-10-CM

## 2022-01-23 DIAGNOSIS — M545 Low back pain, unspecified: Secondary | ICD-10-CM

## 2022-01-23 DIAGNOSIS — G8929 Other chronic pain: Secondary | ICD-10-CM

## 2022-05-25 ENCOUNTER — Encounter: Payer: Self-pay | Admitting: Medical

## 2022-05-25 ENCOUNTER — Ambulatory Visit (INDEPENDENT_AMBULATORY_CARE_PROVIDER_SITE_OTHER): Payer: 59 | Admitting: Medical

## 2022-05-25 VITALS — BP 122/80 | HR 67 | Ht 69.0 in | Wt 177.8 lb

## 2022-05-25 DIAGNOSIS — Z0000 Encounter for general adult medical examination without abnormal findings: Secondary | ICD-10-CM

## 2022-05-25 DIAGNOSIS — E785 Hyperlipidemia, unspecified: Secondary | ICD-10-CM | POA: Diagnosis not present

## 2022-05-25 DIAGNOSIS — G8929 Other chronic pain: Secondary | ICD-10-CM

## 2022-05-25 DIAGNOSIS — Z8249 Family history of ischemic heart disease and other diseases of the circulatory system: Secondary | ICD-10-CM | POA: Diagnosis not present

## 2022-05-25 DIAGNOSIS — Z7185 Encounter for immunization safety counseling: Secondary | ICD-10-CM

## 2022-05-25 DIAGNOSIS — M545 Low back pain, unspecified: Secondary | ICD-10-CM

## 2022-05-25 LAB — COMPREHENSIVE METABOLIC PANEL
ALT: 21 IU/L (ref 0–44)
AST: 26 IU/L (ref 0–40)
Albumin/Globulin Ratio: 1.4 (ref 1.2–2.2)
Albumin: 4.5 g/dL (ref 4.0–5.0)
Alkaline Phosphatase: 99 IU/L (ref 44–121)
BUN/Creatinine Ratio: 12 (ref 9–20)
BUN: 11 mg/dL (ref 6–24)
Bilirubin Total: 0.8 mg/dL (ref 0.0–1.2)
CO2: 25 mmol/L (ref 20–29)
Calcium: 9.6 mg/dL (ref 8.7–10.2)
Chloride: 102 mmol/L (ref 96–106)
Creatinine, Ser: 0.9 mg/dL (ref 0.76–1.27)
Globulin, Total: 3.3 g/dL (ref 1.5–4.5)
Glucose: 90 mg/dL (ref 70–99)
Potassium: 4.4 mmol/L (ref 3.5–5.2)
Sodium: 139 mmol/L (ref 134–144)
Total Protein: 7.8 g/dL (ref 6.0–8.5)
eGFR: 111 mL/min/{1.73_m2} (ref >59)

## 2022-05-25 LAB — URINALYSIS, ROUTINE W REFLEX MICROSCOPIC
Bilirubin, UA: NEGATIVE
Glucose, UA: NEGATIVE
Ketones, UA: NEGATIVE
Leukocytes,UA: NEGATIVE
Nitrite, UA: NEGATIVE
Protein,UA: NEGATIVE
RBC, UA: NEGATIVE
Specific Gravity, UA: 1.022 (ref 1.005–1.030)
Urobilinogen, Ur: 0.2 mg/dL (ref 0.2–1.0)
pH, UA: 6 (ref 5.0–7.5)

## 2022-05-25 LAB — LIPID PANEL
Chol/HDL Ratio: 3.1 ratio (ref 0.0–5.0)
Cholesterol, Total: 119 mg/dL (ref 100–199)
HDL: 38 mg/dL — ABNORMAL LOW (ref >39)
LDL Chol Calc (NIH): 71 mg/dL (ref 0–99)
Triglycerides: 42 mg/dL (ref 0–149)
VLDL Cholesterol Cal: 10 mg/dL (ref 5–40)

## 2022-05-25 LAB — CBC
Hematocrit: 45.7 % (ref 37.5–51.0)
Hemoglobin: 14.8 g/dL (ref 13.0–17.7)
MCH: 27.8 pg (ref 26.6–33.0)
MCHC: 32.4 g/dL (ref 31.5–35.7)
MCV: 86 fL (ref 79–97)
Platelets: 319 10*3/uL (ref 150–450)
RBC: 5.32 x10E6/uL (ref 4.14–5.80)
RDW: 12 % (ref 11.6–15.4)
WBC: 6.4 10*3/uL (ref 3.4–10.8)

## 2022-05-25 NOTE — Progress Notes (Signed)
Subjective:   HPI  Terrence Santiago is a 41 y.o. male who presents for Chief Complaint  Patient presents with   fasting cpe    Fasting cpe, no concerns    Patient Care Team: Aleane Wesenberg, Leward Quan as PCP - General (Family Medicine) Sees dentist Sees eye doctor Dr. Ellison Hughs, [facility]  Concerns: None, doing well, exercising.   Reviewed their medical, surgical, family, social, medication, and allergy history and updated chart as appropriate.  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   Per CT   Gall stone 2022   per 01/2021 CT scan   History of kidney stones    Hyperlipidemia     Past Surgical History:  Procedure Laterality Date   LACERATION REPAIR     left hand   ROBOTIC ASSITED PARTIAL NEPHRECTOMY Left 01/24/2021   Procedure: XI ROBOTIC ASSITED PARTIAL NEPHRECTOMY;  Surgeon: Ceasar Mons, MD;  Location: [facility];  Service: Urology;  Laterality: Left;    Family History  Problem Relation Age of Onset   Hypertension Father    Heart disease Father 96       MI   Cancer Maternal Uncle    Diabetes Maternal Grandmother    Stroke Neg Hx      Current Outpatient Medications:    rosuvastatin (CRESTOR) 10 MG tablet, Take 1 tablet (10 mg total) by mouth daily., Disp: 90 tablet, Rfl: 3  No Known Allergies   Review of Systems Constitutional: -fever, -chills, -sweats, -unexpected weight change, -decreased appetite, -fatigue Allergy: -sneezing, -itching, -congestion Dermatology: -changing moles, --rash, -lumps ENT: -runny nose, -ear pain, -sore throat, -hoarseness, -sinus pain, -teeth pain, - ringing in ears, -hearing loss, -nosebleeds Cardiology: -chest pain, -palpitations, -swelling, -difficulty breathing when lying flat, -waking up short of breath Respiratory: -cough, -shortness of breath, -difficulty breathing with exercise or exertion, -wheezing, -coughing up blood Gastroenterology: -abdominal pain, -nausea,  -vomiting, -diarrhea, -constipation, -blood in stool, -changes in bowel movement, -difficulty swallowing or eating Hematology: -bleeding, -bruising  Musculoskeletal: -joint aches, -muscle aches, -joint swelling, -back pain, -neck pain, -cramping, -changes in gait Ophthalmology: denies vision changes, eye redness, itching, discharge Urology: -burning with urination, -difficulty urinating, -blood in urine, -urinary frequency, -urgency, -incontinence Neurology: -headache, -weakness, -tingling, -numbness, -memory loss, -falls, -dizziness Psychology: -depressed mood, -agitation, -sleep problems Male GU: no testicular mass, pain, no lymph nodes swollen, no swelling, no rash.     05/25/2022    8:34 AM 01/09/2022   11:55 AM 05/20/2021    8:23 AM 05/13/2020    1:46 PM 11/23/2018   10:19 AM  Depression screen PHQ 2/9  Decreased Interest 0 0 0 0 0  Down, Depressed, Hopeless 0 0 0 0 0  PHQ - 2 Score 0 0 0 0 0        Objective:  BP 122/80   Pulse 67   Ht 5\' 9"  (1.753 m)   Wt 177 lb 12.8 oz (80.6 kg)   BMI 26.26 kg/m   General appearance: alert, no distress, WD/WN, African American male Skin: unremarkable, right upper arm laterally with "scooter" tattoo HEENT: normocephalic, conjunctiva/corneas normal, sclerae anicteric, PERRLA, EOMi, nares patent, no discharge or erythema, pharynx normal Oral cavity: MMM, tongue normal, teeth normal Neck: supple, no lymphadenopathy, no thyromegaly, no masses, normal ROM, no bruits Chest: non tender, normal shape and expansion Heart: RRR, normal S1, S2, no murmurs Lungs: CTA bilaterally, no wheezes, rhonchi, or rales Abdomen: +bs, soft, non tender, non distended, no masses, no hepatomegaly,  no splenomegaly, no bruits Back: non tender, normal ROM, no scoliosis Musculoskeletal: upper extremities non tender, no obvious deformity, normal ROM throughout, lower extremities non tender, no obvious deformity, normal ROM throughout Extremities: no edema, no cyanosis, no  clubbing Pulses: 2+ symmetric, upper and lower extremities, normal cap refill Neurological: alert, oriented x 3, CN2-12 intact, strength normal upper extremities and lower extremities, sensation normal throughout, DTRs 2+ throughout, no cerebellar signs, gait normal Psychiatric: normal affect, behavior normal, pleasant  GU: normal male external genitalia,circumcised, nontender, no masses, no hernia, no lymphadenopathy Rectal: unremarkable   Assessment and Plan :   Encounter Diagnoses  Name Primary?   Encounter for health maintenance examination in adult Yes   Vaccine counseling    Family history of premature CAD    Dyslipidemia, goal LDL below 130    Chronic bilateral low back pain without sciatica     This visit was a preventative care visit, also known as wellness visit or routine physical.   Topics typically include healthy lifestyle, diet, exercise, preventative care, vaccinations, sick and well care, proper use of emergency dept and after hours care, as well as other concerns.     Recommendations: Continue to return yearly for your annual wellness and preventative care visits.  This gives [place] a chance to discuss healthy lifestyle, exercise, vaccinations, review your chart record, and perform screenings where appropriate.  I recommend you see your eye doctor yearly for routine vision care.  I recommend you see your dentist yearly for routine dental care including hygiene visits twice yearly.   Vaccination recommendations were reviewed Immunization History  Administered Date(s) Administered   Influenza,inj,Quad PF,6+ Mos 11/22/2014   PFIZER(Purple Top)SARS-COV-2 Vaccination 01/10/2020, 01/31/2020   Tdap 05/13/2020     Screening for cancer: Colon cancer screening: Age 64  Testicular cancer screening You should do a monthly self testicular exam if you are between 6-65 years old  We discussed PSA, prostate exam, and prostate cancer screening risks/benefits.   Age  72  Skin cancer screening: Check your skin regularly for new changes, growing lesions, or other lesions of concern Come in for evaluation if you have skin lesions of concern.  Lung cancer screening: If you have a greater than 20 pack year history of tobacco use, then you may qualify for lung cancer screening with a chest CT scan.   Please call your insurance company to inquire about coverage for this test.  We currently don't have screenings for other cancers besides breast, cervical, colon, and lung cancers.  If you have a strong family history of cancer or have other cancer screening concerns, please let me know.    Bone health: Get at least 150 minutes of aerobic exercise weekly Get weight bearing exercise at least once weekly Bone density test:  A bone density test is an imaging test that uses a type of X-ray to measure the amount of calcium and other minerals in your bones. The test may be used to diagnose or screen you for a condition that causes weak or thin bones (osteoporosis), predict your risk for a broken bone (fracture), or determine how well your osteoporosis treatment is working. The bone density test is recommended for females 68 and older, or females or males [id number] if certain risk factors such as thyroid disease, long term use of steroids such as for asthma or rheumatological issues, vitamin D deficiency, estrogen deficiency, family history of osteoporosis, self or family history of fragility fracture in first degree relative.    Heart  health: Get at least 150 minutes of aerobic exercise weekly Limit alcohol It is important to maintain a healthy blood pressure and healthy cholesterol numbers  Heart disease screening: Screening for heart disease includes screening for blood pressure, fasting lipids, glucose/diabetes screening, BMI height to weight ratio, reviewed of smoking status, physical activity, and diet.    Goals include blood pressure 120/80 or less, maintaining a  healthy lipid/cholesterol profile, preventing diabetes or keeping diabetes numbers under good control, not smoking or using tobacco products, exercising most days per week or at least 150 minutes per week of exercise, and eating healthy variety of fruits and vegetables, healthy oils, and avoiding unhealthy food choices like fried food, fast food, high sugar and high cholesterol foods.    Other tests may possibly include EKG test, CT coronary calcium score, echocardiogram, exercise treadmill stress test.    Medical care options: I recommend you continue to seek care here first for routine care.  We try really hard to have available appointments Monday through Friday daytime hours for sick visits, acute visits, and physicals.  Urgent care should be used for after hours and weekends for significant issues that cannot wait till the next day.  The emergency department should be used for significant potentially life-threatening emergencies.  The emergency department is expensive, can often have long wait times for less significant concerns, so try to utilize primary care, urgent care, or telemedicine when possible to avoid unnecessary trips to the emergency department.  Virtual visits and telemedicine have been introduced since the pandemic started in 2020, and can be convenient ways to receive medical care.  We offer virtual appointments as well to assist you in a variety of options to seek medical care.    Separate significant issues discussed: Hyperlipidemia - compliant, labs today   Robie was seen today for fasting cpe.  Diagnoses and all orders for this visit:  Encounter for health maintenance examination in adult  Vaccine counseling  Family history of premature CAD  Dyslipidemia, goal LDL below 130  Chronic bilateral low back pain without sciatica   Follow-up pending labs, yearly for physical

## 2022-05-26 ENCOUNTER — Other Ambulatory Visit: Payer: Self-pay | Admitting: Medical

## 2022-05-26 MED ORDER — ROSUVASTATIN CALCIUM 10 MG PO TABS: 10.0000 mg | ORAL_TABLET | Freq: Every day | ORAL | 3 refills | Status: DC

## 2022-06-30 ENCOUNTER — Ambulatory Visit: Payer: 59 | Admitting: Medical

## 2022-06-30 VITALS — BP 120/80 | HR 80 | Wt 182.0 lb

## 2022-06-30 DIAGNOSIS — M62838 Other muscle spasm: Secondary | ICD-10-CM | POA: Diagnosis not present

## 2022-06-30 DIAGNOSIS — S46911A Strain of unspecified muscle, fascia and tendon at shoulder and upper arm level, right arm, initial encounter: Secondary | ICD-10-CM

## 2022-06-30 MED ORDER — TIZANIDINE HCL 4 MG PO TABS: 4.0000 mg | ORAL_TABLET | Freq: Four times a day (QID) | ORAL | 0 refills | Status: DC | PRN

## 2022-06-30 NOTE — Progress Notes (Signed)
Subjective:  Terrence Santiago is a 41 y.o. male who presents for Chief Complaint  Patient presents with   Acute Visit    Right shoulder pain for almost 2 weeks that has gotten worse     Here for right shoulder pain for 2 weeks.  No specific injury or trauma, but is active regularly with exercise, weights.   Pain in right upper back and shoulder.   Lying in the bed and trying to move shoulder hurts, and abduction hurts.   Even sitting feels pain radiating up to neck.   Used some icy hot patches and Biofreeze patches . Used some aleve.  No other aggravating or relieving factors.    No other c/o.  The following portions of the patient's history were reviewed and updated as appropriate: allergies, current medications, past family history, past medical history, past social history, past surgical history and problem list.  ROS Otherwise as in subjective above  Objective: BP 120/80   Pulse 80   Wt 182 lb (82.6 kg)   SpO2 97%   BMI 26.88 kg/m   General appearance: alert, no distress, well developed, well nourished Neck: supple, no lymphadenopathy, no thyromegaly, no masses, supple but tender over the right lateral and posterior lateral neck.  No midline tenderness.  Range of motion is full. MSK: Right shoulder nontender to palpation, no deformity or laxity this seems to be somewhat tense in the right upper back, shoulder girdle.  He notes some pain in the right neck and upper back with abduction but not specifically painful in the shoulder.  Otherwise shoulder range of motion is full, rest of bilateral arm exam unremarkable Back: Right upper back/supraspinatus area tender but no swelling, back otherwise unremarkable Pulses: 2+ radial pulses, 2+ pedal pulses, normal cap refill Ext: no edema   Assessment: Encounter Diagnoses  Name Primary?   Muscle spasm Yes   Shoulder strain, right, initial encounter      Plan: Exam and symptoms suggest mild spasm and shoulder sprain upper back strain.   We discussed using over-the-counter Aleve twice daily for the next 5 days, relative rest, stretching, can use over-the-counter arm sling for support this week on and off.  I demonstrated couple stretches for him to use.  Can use tizanidine muscle laxer as needed for the next 2 days.  Caution with sedation.  I suspect symptoms will resolve over the next week to 10 days.  If not improving within that timeframe then call or recheck  Terrence Santiago was seen today for acute visit.  Diagnoses and all orders for this visit:  Muscle spasm  Shoulder strain, right, initial encounter  Other orders -     tiZANidine (ZANAFLEX) 4 MG tablet; Take 1 tablet (4 mg total) by mouth every 6 (six) hours as needed for muscle spasms.    Follow up: prn

## 2022-08-26 ENCOUNTER — Encounter: Payer: Self-pay | Admitting: Internal Medicine

## 2023-05-28 ENCOUNTER — Encounter: Payer: Self-pay | Admitting: Medical

## 2023-05-28 ENCOUNTER — Ambulatory Visit (INDEPENDENT_AMBULATORY_CARE_PROVIDER_SITE_OTHER): Payer: 59 | Admitting: Medical

## 2023-05-28 VITALS — BP 124/80 | HR 83 | Ht 69.0 in | Wt 188.2 lb

## 2023-05-28 DIAGNOSIS — E785 Hyperlipidemia, unspecified: Secondary | ICD-10-CM

## 2023-05-28 DIAGNOSIS — Z136 Encounter for screening for cardiovascular disorders: Secondary | ICD-10-CM

## 2023-05-28 DIAGNOSIS — Z8249 Family history of ischemic heart disease and other diseases of the circulatory system: Secondary | ICD-10-CM | POA: Diagnosis not present

## 2023-05-28 DIAGNOSIS — Z0000 Encounter for general adult medical examination without abnormal findings: Secondary | ICD-10-CM | POA: Diagnosis not present

## 2023-05-28 LAB — CMP14+EGFR
ALT: 21 IU/L (ref 0–44)
AST: 23 IU/L (ref 0–40)
Albumin/Globulin Ratio: 1.4 (ref 1.2–2.2)
Albumin: 4.6 g/dL (ref 4.1–5.1)
Alkaline Phosphatase: 100 IU/L (ref 44–121)
BUN/Creatinine Ratio: 11 (ref 9–20)
BUN: 11 mg/dL (ref 6–24)
Bilirubin Total: 0.9 mg/dL (ref 0.0–1.2)
CO2: 25 mmol/L (ref 20–29)
Calcium: 9.7 mg/dL (ref 8.7–10.2)
Chloride: 101 mmol/L (ref 96–106)
Creatinine, Ser: 0.98 mg/dL (ref 0.76–1.27)
Globulin, Total: 3.4 g/dL (ref 1.5–4.5)
Glucose: 89 mg/dL (ref 70–99)
Potassium: 4.6 mmol/L (ref 3.5–5.2)
Sodium: 140 mmol/L (ref 134–144)
Total Protein: 8 g/dL (ref 6.0–8.5)
eGFR: 99 mL/min/{1.73_m2} (ref >59)

## 2023-05-28 LAB — POCT URINALYSIS DIP (PROADVANTAGE DEVICE)
Bilirubin, UA: NEGATIVE
Blood, UA: NEGATIVE
Glucose, UA: NEGATIVE mg/dL
Ketones, POC UA: NEGATIVE mg/dL
Leukocytes, UA: NEGATIVE
Nitrite, UA: NEGATIVE
Specific Gravity, Urine: 1.01
Urobilinogen, Ur: 0.2
pH, UA: 7 (ref 5.0–8.0)

## 2023-05-28 LAB — CBC WITH DIFFERENTIAL/PLATELET
Basophils Absolute: 0 10*3/uL (ref 0.0–0.2)
Basos: 0 %
EOS (ABSOLUTE): 0.1 10*3/uL (ref 0.0–0.4)
Eos: 1 %
Hematocrit: 48.1 % (ref 37.5–51.0)
Hemoglobin: 15.8 g/dL (ref 13.0–17.7)
Immature Grans (Abs): 0 10*3/uL (ref 0.0–0.1)
Immature Granulocytes: 0 %
Lymphocytes Absolute: 3.6 10*3/uL — ABNORMAL HIGH (ref 0.7–3.1)
Lymphs: 42 %
MCH: 28.3 pg (ref 26.6–33.0)
MCHC: 32.8 g/dL (ref 31.5–35.7)
MCV: 86 fL (ref 79–97)
Monocytes Absolute: 0.6 10*3/uL (ref 0.1–0.9)
Monocytes: 7 %
Neutrophils Absolute: 4.2 10*3/uL (ref 1.4–7.0)
Neutrophils: 50 %
Platelets: 358 10*3/uL (ref 150–450)
RBC: 5.59 x10E6/uL (ref 4.14–5.80)
RDW: 12.8 % (ref 11.6–15.4)
WBC: 8.5 10*3/uL (ref 3.4–10.8)

## 2023-05-28 LAB — LIPID PANEL
Chol/HDL Ratio: 4 ratio (ref 0.0–5.0)
Cholesterol, Total: 157 mg/dL (ref 100–199)
HDL: 39 mg/dL — ABNORMAL LOW (ref >39)
LDL Chol Calc (NIH): 104 mg/dL — ABNORMAL HIGH (ref 0–99)
Triglycerides: 72 mg/dL (ref 0–149)
VLDL Cholesterol Cal: 14 mg/dL (ref 5–40)

## 2023-05-28 MED ORDER — ROSUVASTATIN CALCIUM 10 MG PO TABS
10.0000 mg | ORAL_TABLET | Freq: Every day | ORAL | 3 refills | Status: DC
Start: 2023-05-28 — End: 2024-05-29

## 2023-05-28 NOTE — Progress Notes (Signed)
Subjective:   HPI  Terrence Santiago is a 42 y.o. male who presents for Chief Complaint  Patient presents with   Annual Exam    Fasting cpe, no concerns    Patient Care Team: Pranish Akhavan, Cleda Mccreedy as PCP - General (Family Medicine) Sees dentist Sees eye doctor Dr. Rhoderick Moody, [facility] prior   Concerns: Been off diet a little due to recent vacation.     None, doing well, exercising.   Reviewed their medical, surgical, family, social, medication, and allergy history and updated chart as appropriate.  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   Per CT   Gall stone 2022   per 01/2021 CT scan   History of kidney stones    Hyperlipidemia     Past Surgical History:  Procedure Laterality Date   LACERATION REPAIR     left hand   ROBOTIC ASSITED PARTIAL NEPHRECTOMY Left 01/24/2021   Procedure: XI ROBOTIC ASSITED PARTIAL NEPHRECTOMY;  Surgeon: Rene Paci, MD;  Location: [facility];  Service: Urology;  Laterality: Left;    Family History  Problem Relation Age of Onset   Hypertension Father    Heart disease Father 24       MI   Cancer Maternal Uncle    Diabetes Maternal Grandmother    Stroke Neg Hx      Current Outpatient Medications:    rosuvastatin (CRESTOR) 10 MG tablet, Take 1 tablet (10 mg total) by mouth daily., Disp: 90 tablet, Rfl: 3  No Known Allergies  Review of Systems  Constitutional:  Negative for chills, fever, malaise/fatigue and weight loss.  HENT:  Negative for congestion, ear pain, hearing loss, sore throat and tinnitus.   Eyes:  Negative for blurred vision, pain and redness.  Respiratory:  Negative for cough, hemoptysis and shortness of breath.   Cardiovascular:  Negative for chest pain, palpitations, orthopnea, claudication and leg swelling.  Gastrointestinal:  Negative for abdominal pain, blood in stool, constipation, diarrhea, nausea and vomiting.  Genitourinary:  Negative for dysuria, flank  pain, frequency, hematuria and urgency.  Musculoskeletal:  Negative for falls, joint pain and myalgias.  Skin:  Negative for itching and rash.  Neurological:  Negative for dizziness, tingling, speech change, weakness and headaches.  Endo/Heme/Allergies:  Negative for polydipsia. Does not bruise/bleed easily.  Psychiatric/Behavioral:  Negative for depression and memory loss. The patient is not nervous/anxious and does not have insomnia.         05/28/2023    8:26 AM 05/25/2022    8:34 AM 01/09/2022   11:55 AM 05/20/2021    8:23 AM 05/13/2020    1:46 PM  Depression screen PHQ 2/9  Decreased Interest 0 0 0 0 0  Down, Depressed, Hopeless 0 0 0 0 0  PHQ - 2 Score 0 0 0 0 0        Objective:  BP 124/80   Pulse 83   Ht 5\' 9"  (1.753 m)   Wt 188 lb 3.2 oz (85.4 kg)   BMI 27.79 kg/m   Wt Readings from Last 3 Encounters:  05/28/23 188 lb 3.2 oz (85.4 kg)  06/30/22 182 lb (82.6 kg)  05/25/22 177 lb 12.8 oz (80.6 kg)   BP Readings from Last 3 Encounters:  05/28/23 124/80  06/30/22 120/80  05/25/22 122/80    General appearance: alert, no distress, WD/WN, African American male Skin: unremarkable, right upper arm laterally with "scooter" tattoo HEENT: normocephalic, conjunctiva/corneas normal, sclerae anicteric, PERRLA, EOMi, nares  patent, no discharge or erythema, pharynx normal Oral cavity: MMM, tongue normal, teeth normal Neck: supple, no lymphadenopathy, no thyromegaly, no masses, normal ROM, no bruits Chest: non tender, normal shape and expansion Heart: RRR, normal S1, S2, no murmurs Lungs: CTA bilaterally, no wheezes, rhonchi, or rales Abdomen: +bs, soft, non tender, non distended, no masses, no hepatomegaly, no splenomegaly, no bruits Back: non tender, normal ROM, no scoliosis Musculoskeletal: upper extremities non tender, no obvious deformity, normal ROM throughout, lower extremities non tender, no obvious deformity, normal ROM throughout Extremities: no edema, no cyanosis, no  clubbing Pulses: 2+ symmetric, upper and lower extremities, normal cap refill Neurological: alert, oriented x 3, CN2-12 intact, strength normal upper extremities and lower extremities, sensation normal throughout, DTRs 2+ throughout, no cerebellar signs, gait normal Psychiatric: normal affect, behavior normal, pleasant  GU: normal male external genitalia,circumcised, nontender, no masses, no hernia, no lymphadenopathy Rectal: unremarkable   Assessment and Plan :   Encounter Diagnoses  Name Primary?   Routine general medical examination at a health care facility Yes   Dyslipidemia, goal LDL below 130    Family history of premature CAD    Screening for heart disease     This visit was a preventative care visit, also known as wellness visit or routine physical.   Topics typically include healthy lifestyle, diet, exercise, preventative care, vaccinations, sick and well care, proper use of emergency dept and after hours care, as well as other concerns.     Recommendations: Continue to return yearly for your annual wellness and preventative care visits.  This gives [place] a chance to discuss healthy lifestyle, exercise, vaccinations, review your chart record, and perform screenings where appropriate.  I recommend you see your eye doctor yearly for routine vision care.  I recommend you see your dentist yearly for routine dental care including hygiene visits twice yearly.   Vaccination recommendations were reviewed Immunization History  Administered Date(s) Administered   Influenza,inj,Quad PF,6+ Mos 11/22/2014   PFIZER(Purple Top)SARS-COV-2 Vaccination 01/10/2020, 01/31/2020   Tdap 05/13/2020     Screening for cancer: Colon cancer screening: Age 45  Testicular cancer screening You should do a monthly self testicular exam if you are between 68-25 years old  We discussed PSA, prostate exam, and prostate cancer screening risks/benefits.   Age 40  Skin cancer screening: Check your  skin regularly for new changes, growing lesions, or other lesions of concern Come in for evaluation if you have skin lesions of concern.  Lung cancer screening: If you have a greater than 20 pack year history of tobacco use, then you may qualify for lung cancer screening with a chest CT scan.   Please call your insurance company to inquire about coverage for this test.  We currently don't have screenings for other cancers besides breast, cervical, colon, and lung cancers.  If you have a strong family history of cancer or have other cancer screening concerns, please let me know.    Bone health: Get at least 150 minutes of aerobic exercise weekly Get weight bearing exercise at least once weekly Bone density test:  A bone density test is an imaging test that uses a type of X-ray to measure the amount of calcium and other minerals in your bones. The test may be used to diagnose or screen you for a condition that causes weak or thin bones (osteoporosis), predict your risk for a broken bone (fracture), or determine how well your osteoporosis treatment is working. The bone density test is recommended for  females 38 and older, or females or males <65 if certain risk factors such as thyroid disease, long term use of steroids such as for asthma or rheumatological issues, vitamin D deficiency, estrogen deficiency, family history of osteoporosis, self or family history of fragility fracture in first degree relative.    Heart health: Get at least 150 minutes of aerobic exercise weekly Limit alcohol It is important to maintain a healthy blood pressure and healthy cholesterol numbers  Heart disease screening: Screening for heart disease includes screening for blood pressure, fasting lipids, glucose/diabetes screening, BMI height to weight ratio, reviewed of smoking status, physical activity, and diet.    Goals include blood pressure 120/80 or less, maintaining a healthy lipid/cholesterol profile,  preventing diabetes or keeping diabetes numbers under good control, not smoking or using tobacco products, exercising most days per week or at least 150 minutes per week of exercise, and eating healthy variety of fruits and vegetables, healthy oils, and avoiding unhealthy food choices like fried food, fast food, high sugar and high cholesterol foods.    Other tests may possibly include EKG test, CT coronary calcium score, echocardiogram, exercise treadmill stress test.    Medical care options: I recommend you continue to seek care here first for routine care.  We try really hard to have available appointments Monday through Friday daytime hours for sick visits, acute visits, and physicals.  Urgent care should be used for after hours and weekends for significant issues that cannot wait till the next day.  The emergency department should be used for significant potentially life-threatening emergencies.  The emergency department is expensive, can often have long wait times for less significant concerns, so try to utilize primary care, urgent care, or telemedicine when possible to avoid unnecessary trips to the emergency department.  Virtual visits and telemedicine have been introduced since the pandemic started in 2020, and can be convenient ways to receive medical care.  We offer virtual appointments as well to assist you in a variety of options to seek medical care.    Separate significant issues discussed: Hyperlipidemia - compliant, labs today.  Referral for CT coronary.   Consider fenofibrate or other to help improved HDL along with healthy diet and vigorous exercise as discussed   Leevon was seen today for annual exam.  Diagnoses and all orders for this visit:  Routine general medical examination at a health care facility -     rosuvastatin (CRESTOR) 10 MG tablet; Take 1 tablet (10 mg total) by mouth daily. -     POCT Urinalysis DIP (Proadvantage Device) -     CBC with  Differential/Platelet -     CMP14+EGFR -     Lipid panel -     CT CARDIAC SCORING (SELF PAY ONLY); Future  Dyslipidemia, goal LDL below 130 -     rosuvastatin (CRESTOR) 10 MG tablet; Take 1 tablet (10 mg total) by mouth daily. -     Lipid panel  Family history of premature CAD -     CT CARDIAC SCORING (SELF PAY ONLY); Future  Screening for heart disease -     CT CARDIAC SCORING (SELF PAY ONLY); Future   Follow-up pending labs, yearly for physical

## 2023-06-23 ENCOUNTER — Ambulatory Visit (HOSPITAL_COMMUNITY)
Admission: RE | Admit: 2023-06-23 | Discharge: 2023-06-23 | Disposition: A | Discharge status: Home / Self Care | Payer: 59 | Source: Ambulatory Visit | Attending: Medical | Admitting: Medical

## 2023-06-23 DIAGNOSIS — Z136 Encounter for screening for cardiovascular disorders: Secondary | ICD-10-CM

## 2023-06-23 DIAGNOSIS — Z0000 Encounter for general adult medical examination without abnormal findings: Secondary | ICD-10-CM

## 2023-06-23 DIAGNOSIS — Z8249 Family history of ischemic heart disease and other diseases of the circulatory system: Secondary | ICD-10-CM

## 2023-06-23 NOTE — Progress Notes (Signed)
Results sent through MyChart

## 2023-09-10 ENCOUNTER — Telehealth: Payer: Self-pay | Admitting: Medical

## 2023-12-16 ENCOUNTER — Ambulatory Visit: Payer: Self-pay

## 2023-12-17 ENCOUNTER — Ambulatory Visit: Payer: 59

## 2023-12-22 DIAGNOSIS — R030 Elevated blood-pressure reading, without diagnosis of hypertension: Secondary | ICD-10-CM

## 2023-12-22 HISTORY — DX: Elevated blood-pressure reading, without diagnosis of hypertension: R03.0

## 2023-12-22 IMAGING — CR DG HIP (WITH OR WITHOUT PELVIS) 3-4V BILAT
5 series · 5 of 5 positions shown · non-contrast
Comparison: None.

CLINICAL DATA: Hip and back pain

EXAM:
DG HIP (WITH OR WITHOUT PELVIS) 3-4V BILAT

[w pelvis upright]
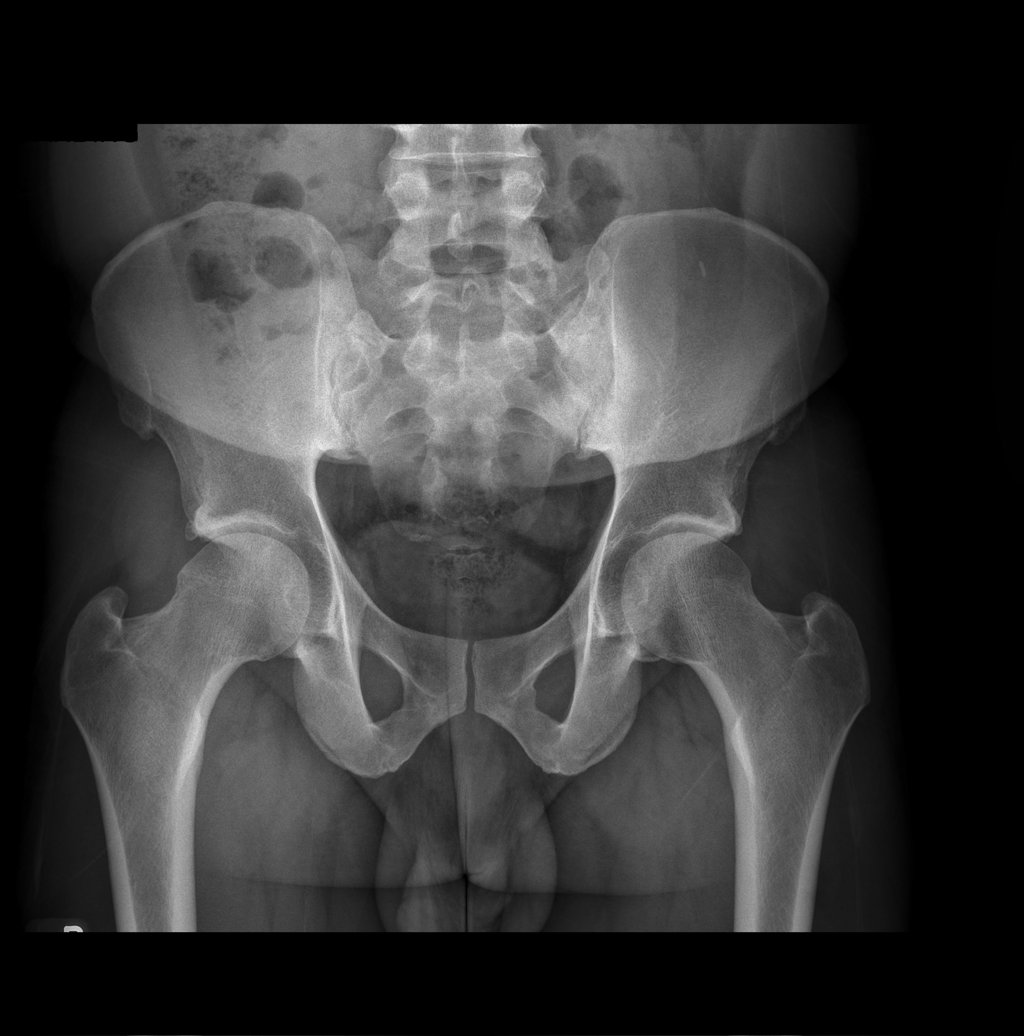

[w hip ap right]
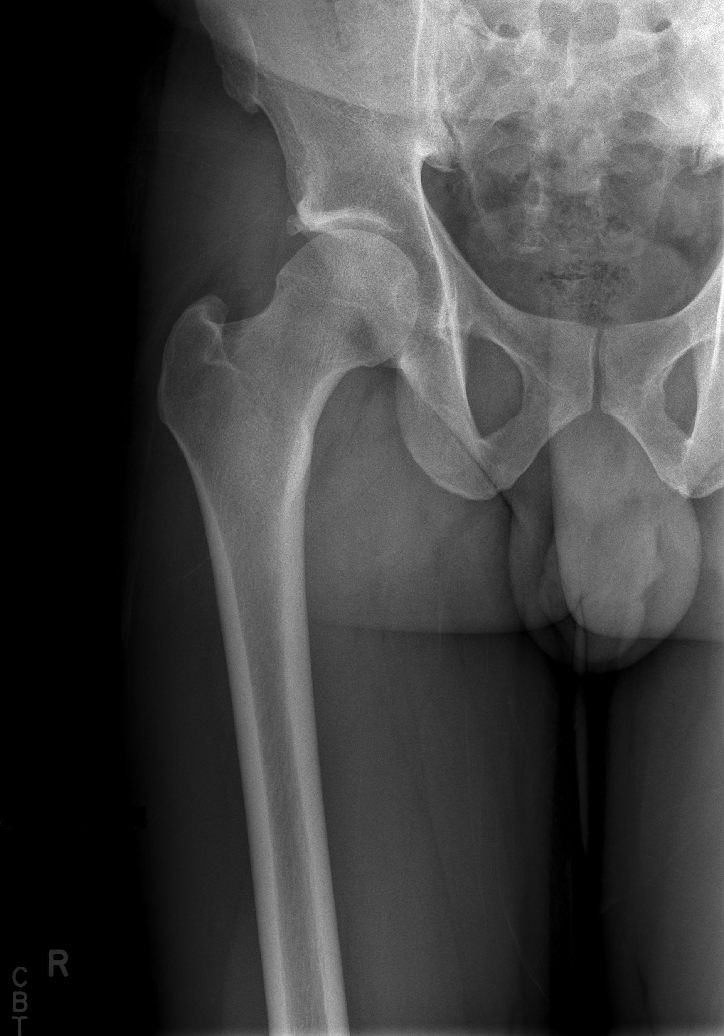

[w hip frog right]
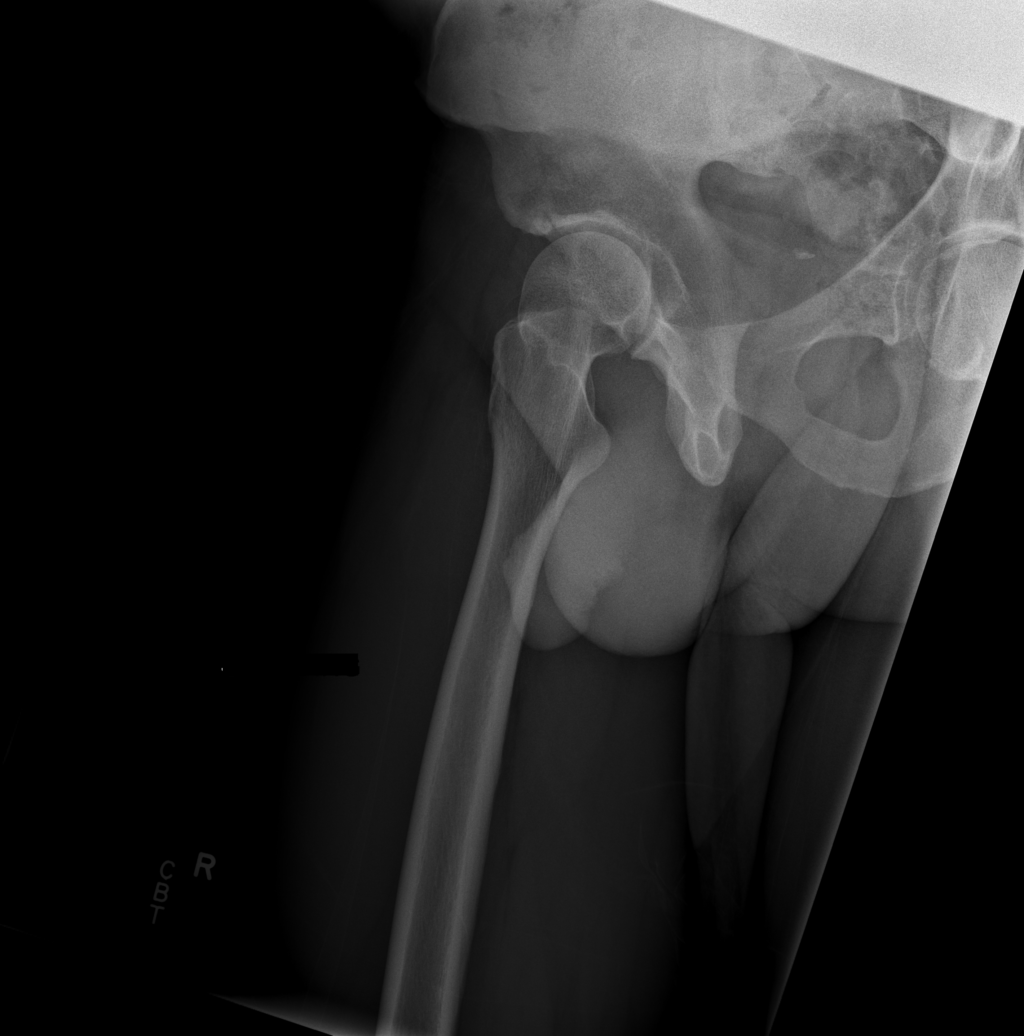

[w hip ap left]
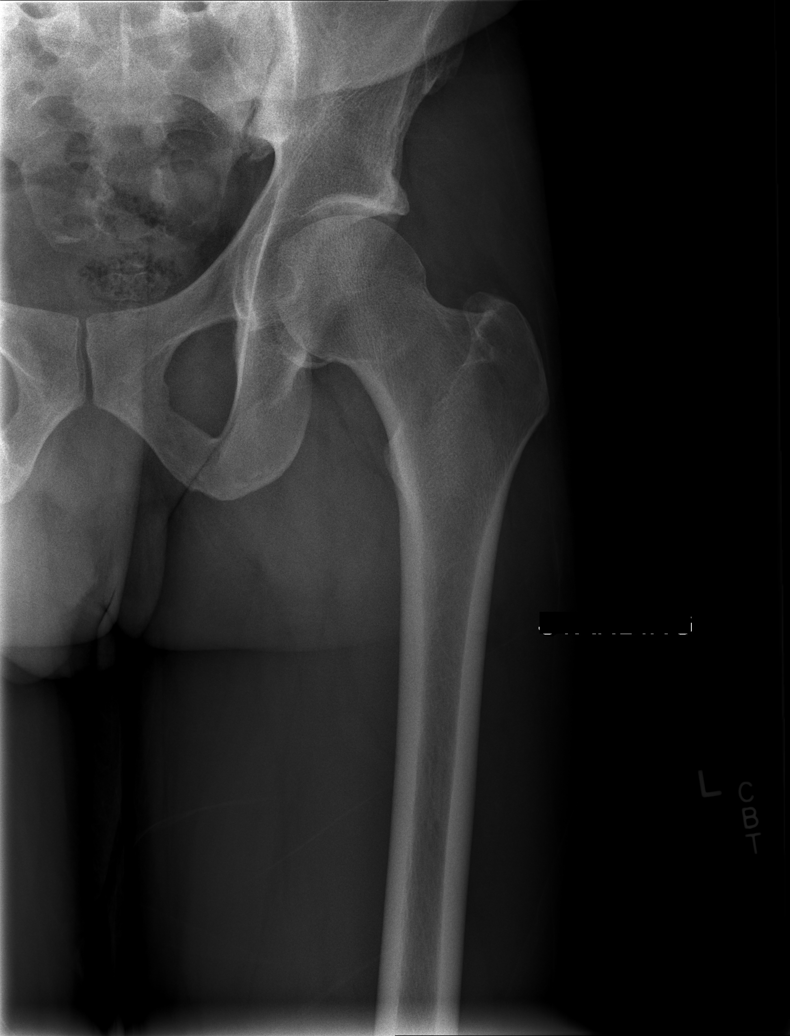

[w hip frog left]
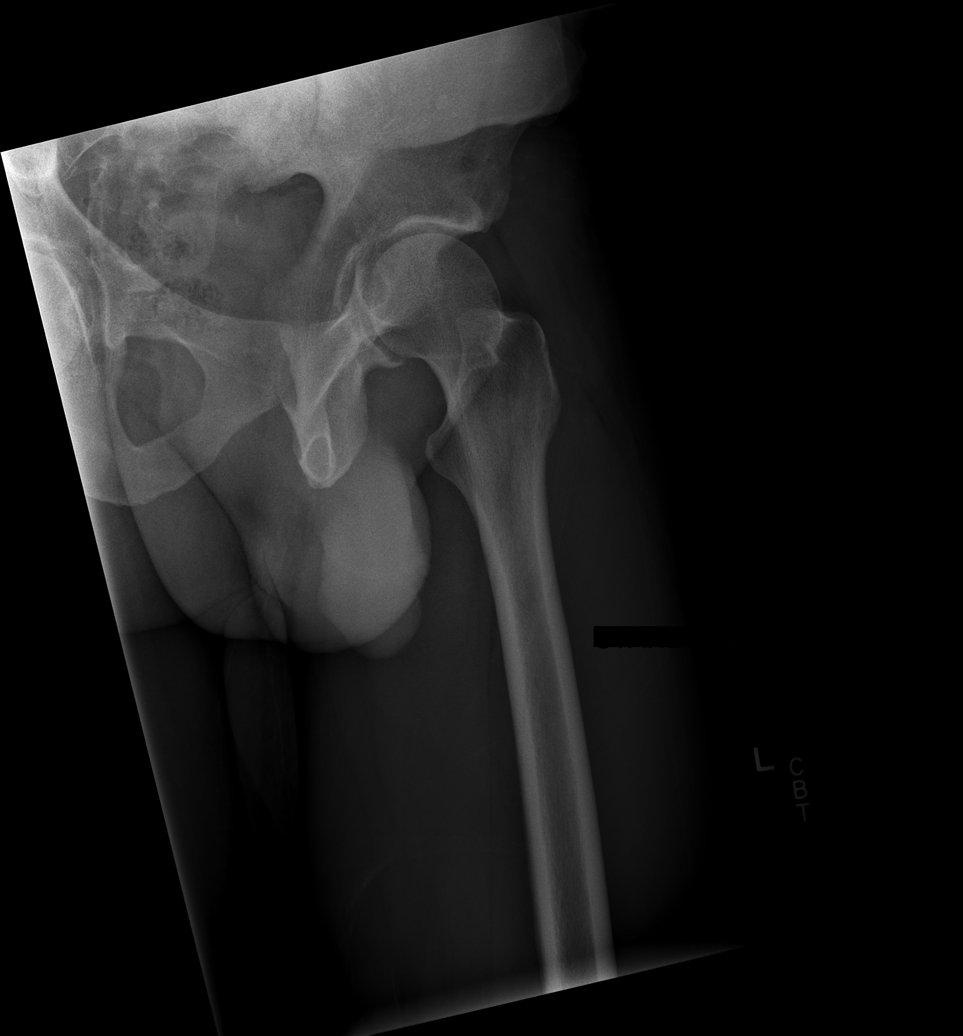

[5 of 5 positions shown; findings below may reference images not displayed]

FINDINGS: There is no evidence of hip fracture or dislocation. There is no
evidence of arthropathy or other focal bone abnormality.
IMPRESSION: Negative.

## 2023-12-26 ENCOUNTER — Emergency Department (HOSPITAL_COMMUNITY)
Admission: EM | Admit: 2023-12-26 | Discharge: 2023-12-26 | Disposition: A | Discharge status: Home / Self Care | Payer: 59 | Attending: Emergency Medicine | Admitting: Emergency Medicine

## 2023-12-26 ENCOUNTER — Encounter (HOSPITAL_COMMUNITY): Payer: Self-pay | Admitting: Emergency Medicine

## 2023-12-26 ENCOUNTER — Ambulatory Visit
Admission: EM | Admit: 2023-12-26 | Discharge: 2023-12-26 | Disposition: A | Discharge status: Other Acute Inpatient Hospital | Payer: 59 | Attending: Internal Medicine | Admitting: Internal Medicine

## 2023-12-26 ENCOUNTER — Other Ambulatory Visit: Payer: Self-pay

## 2023-12-26 ENCOUNTER — Emergency Department (HOSPITAL_COMMUNITY): Payer: 59

## 2023-12-26 DIAGNOSIS — I10 Essential (primary) hypertension: Secondary | ICD-10-CM

## 2023-12-26 DIAGNOSIS — R072 Precordial pain: Secondary | ICD-10-CM | POA: Diagnosis not present

## 2023-12-26 DIAGNOSIS — R079 Chest pain, unspecified: Secondary | ICD-10-CM | POA: Diagnosis not present

## 2023-12-26 DIAGNOSIS — R011 Cardiac murmur, unspecified: Secondary | ICD-10-CM | POA: Diagnosis not present

## 2023-12-26 DIAGNOSIS — R0789 Other chest pain: Secondary | ICD-10-CM | POA: Diagnosis present

## 2023-12-26 LAB — BASIC METABOLIC PANEL
Anion gap: 8 (ref 5–15)
BUN: 12 mg/dL (ref 6–20)
CO2: 28 mmol/L (ref 22–32)
Calcium: 9 mg/dL (ref 8.9–10.3)
Chloride: 101 mmol/L (ref 98–111)
Creatinine, Ser: 0.61 mg/dL (ref 0.61–1.24)
GFR, Estimated: >60 mL/min (ref >60)
Glucose, Bld: 145 mg/dL — ABNORMAL HIGH (ref 70–99)
Potassium: 4.3 mmol/L (ref 3.5–5.1)
Sodium: 137 mmol/L (ref 135–145)

## 2023-12-26 LAB — CBC WITH DIFFERENTIAL/PLATELET
Abs Immature Granulocytes: 0.02 10*3/uL (ref 0.00–0.07)
Basophils Absolute: 0 10*3/uL (ref 0.0–0.1)
Basophils Relative: 1 %
Eosinophils Absolute: 0.1 10*3/uL (ref 0.0–0.5)
Eosinophils Relative: 1 %
HCT: 49.5 % (ref 39.0–52.0)
Hemoglobin: 15.8 g/dL (ref 13.0–17.0)
Immature Granulocytes: 0 %
Lymphocytes Relative: 41 %
Lymphs Abs: 3.4 10*3/uL (ref 0.7–4.0)
MCH: 28.5 pg (ref 26.0–34.0)
MCHC: 31.9 g/dL (ref 30.0–36.0)
MCV: 89.2 fL (ref 80.0–100.0)
Monocytes Absolute: 0.4 10*3/uL (ref 0.1–1.0)
Monocytes Relative: 5 %
Neutro Abs: 4.2 10*3/uL (ref 1.7–7.7)
Neutrophils Relative %: 52 %
Platelets: 357 10*3/uL (ref 150–400)
RBC: 5.55 MIL/uL (ref 4.22–5.81)
RDW: 12.8 % (ref 11.5–15.5)
WBC: 8.1 10*3/uL (ref 4.0–10.5)
nRBC: 0 % (ref 0.0–0.2)

## 2023-12-26 LAB — D-DIMER, QUANTITATIVE: D-Dimer, Quant: 0.4 ug{FEU}/mL (ref 0.00–0.50)

## 2023-12-26 LAB — TROPONIN I (HIGH SENSITIVITY): Troponin I (High Sensitivity): 3 ng/L (ref <18)

## 2023-12-26 MED ORDER — AMLODIPINE BESYLATE 10 MG PO TABS: 10.0000 mg | ORAL_TABLET | Freq: Every day | ORAL | 0 refills | Status: DC

## 2023-12-26 NOTE — Progress Notes (Signed)
 ON-CALL CARDIOLOGY 12/26/23  Patient's name: Terrence Santiago.   MRN: [id number].    DOB: [birth year] Primary care provider: Bulah Alm RAMAN, PA-C. Primary cardiologist: None  Interaction regarding this patient's care today: Dr. Towana from emergency room department reached out to discuss this patient's case.  Based on our discussion he presents today with atypical chest pain that is more pronounced with turning side-to-side and getting out of bed.  Hemodynamically remains stable.  Blood pressures are elevated no formal diagnosis of hypertension  Impression: Precordial pain Uncontrolled blood pressures Hyperlipidemia  No orders of the defined types were placed in this encounter.  EKG 12/26/2023: Sinus tachycardia, 101 bpm, nonspecific ST-T changes  Orders Placed This Encounter  Procedures   DG Chest Port 1 View   Basic metabolic panel   CBC with Differential   D-dimer, quantitative   Inpatient consult to Cardiology   EKG 12-Lead    Recommendations: Precordial pain: EKG does not illustrate STEMI but does have nonspecific ST-T changes. High sensitive troponin negative x 1.  D-dimer is within normal limits Coronary calcium  score is 0. Patient is a non-smoker. Family history of heart disease. Recommend starting amlodipine  10 mg p.o. daily for better blood pressure management. Ambulatory blood pressure monitoring recommended Based on discussion with Dr. Towana that shared decision was recommend outpatient follow-up.  Schedule appointment for 01/07/2024.  Patient is advised to come back to the ED if he has recurrence of symptoms.   Total time spent: 15 minutes  Madonna Large, DO, [facility]  [facility]  [address] [place], [place] [address] 12/26/2023 1:56 PM

## 2023-12-26 NOTE — ED Provider Notes (Signed)
 Patient here today for evaluation of chest pain. Started with activity. Is not constant, no associated shortness of breath- does seem to occur with strenuous activity but patient has not been working out over the last few weeks. He does have family history of early CAD but has coronary CT in 2024 with calcium  score of 0. EKG today does have an inversion of t waves in leads V3, V4 and aVf which was not present in EKG from 3 years ago. Patient is stable, lower suspicion for cardiac etiology but discussed given presentation and EKG changes with positive FH that he should be evaluated in ED for stat troponins to definitively rule out cardiac cause of pain. Patient is agreeable.   Terrence Asberry FALCON, PA-C 12/26/23 1050

## 2023-12-26 NOTE — ED Provider Notes (Signed)
 [place] EMERGENCY DEPARTMENT AT [facility] Provider Note   CSN: [id number] Arrival date & time: 12/26/23  1106     History  Chief Complaint  Patient presents with   Chest Pain    Terrence Santiago is a 43 y.o. male.  He is here for evaluation of left-sided chest pain.  He said it has been going on a week, he initially thought it was muscular.  Had been working on the gym.  Been getting a little more progress over the last 2 days and went to urgent care and they referred him here for further evaluation.  Does have a family history of heart disease but no personal history.  He said the pain is mostly if he moves his trunk.  Not associated with shortness of breath diaphoresis nausea or vomiting.  Does not radiate into back.  Has tried nothing for it.  He denies tobacco use or cocaine.  Patient does say he thinks he was told he has a heart murmur when he was younger.  It also looks like he had a chest CT done last summer that showed 0 calcium  score without any significant findings  The history is provided by the patient.  Chest Pain Pain location:  L chest Pain radiates to:  Does not radiate Pain severity:  Moderate Onset quality:  Gradual Duration:  1 week Timing:  Intermittent Progression:  Unchanged Chronicity:  New Context: movement   Relieved by:  None tried Worsened by:  Certain positions and movement Ineffective treatments:  Rest Associated symptoms: no cough, no diaphoresis, no dizziness, no fever, no nausea, no shortness of breath and no vomiting   Risk factors: no smoking        Home Medications Prior to Admission medications   Medication Sig Start Date End Date Taking? Authorizing Provider  Multiple Vitamin (MULTI-VITAMIN) tablet Take by mouth. 10/31/18   [provider]  prednisoLONE  acetate (PRED FORTE ) 1 % ophthalmic suspension INSTILL 1 DROP INTO RIGHT EYE EVERY FOUR HOURS AS DIRECTED SHAKE BEFORE INSTILLING. 09/22/18   [provider]   rosuvastatin  (CRESTOR ) 10 MG tablet Take 1 tablet (10 mg total) by mouth daily. 05/28/23 05/27/24  Tysinger, Alm RAMAN, PA-C      Allergies    Patient has no known allergies.    Review of Systems   Review of Systems  Constitutional:  Negative for diaphoresis and fever.  Eyes:  Negative for visual disturbance.  Respiratory:  Negative for cough and shortness of breath.   Cardiovascular:  Positive for chest pain.  Gastrointestinal:  Negative for nausea and vomiting.  Neurological:  Negative for dizziness.    Physical Exam Updated Vital Signs BP (!) 173/98 (BP Location: Left Arm)   Pulse (!) 103   Temp 98.8 F (37.1 C) (Oral)   Resp 18   Ht 5' 9 (1.753 m)   Wt 86 kg   SpO2 98%   BMI 28.00 kg/m  Physical Exam Vitals and nursing note reviewed.  Constitutional:      General: He is not in acute distress.    Appearance: He is well-developed.  HENT:     Head: Normocephalic and atraumatic.  Eyes:     Conjunctiva/sclera: Conjunctivae normal.  Cardiovascular:     Rate and Rhythm: Normal rate and regular rhythm.     Heart sounds: Murmur heard.     Systolic murmur is present.  Pulmonary:     Effort: Pulmonary effort is normal. No respiratory distress.  Breath sounds: Normal breath sounds.  Abdominal:     Palpations: Abdomen is soft.     Tenderness: There is no abdominal tenderness.  Musculoskeletal:        General: No swelling. Normal range of motion.     Cervical back: Neck supple.     Right lower leg: No tenderness. No edema.     Left lower leg: No tenderness. No edema.  Skin:    General: Skin is warm and dry.     Capillary Refill: Capillary refill takes less than 2 seconds.  Neurological:     General: No focal deficit present.     Mental Status: He is alert.     ED Results / Procedures / Treatments   Labs (all labs ordered are listed, but only abnormal results are displayed) Labs Reviewed  BASIC METABOLIC PANEL - Abnormal; Notable for the following components:       Result Value   Glucose, Bld 145 (*)    All other components within normal limits  CBC WITH DIFFERENTIAL/PLATELET  D-DIMER, QUANTITATIVE  TROPONIN I (HIGH SENSITIVITY)    EKG EKG Interpretation Date/Time:  Sunday December 26 2023 11:14:06 EST Ventricular Rate:  101 PR Interval:  168 QRS Duration:  73 QT Interval:  284 QTC Calculation: 368 R Axis:   75  Text Interpretation: Sinus tachycardia Consider left atrial enlargement Borderline T wave abnormalities no priors before today to compare with Confirmed by Towana Sharper [phone]) on 12/26/2023 11:17:02 AM  Radiology DG Chest Port 1 View Result Date: 12/26/2023 CLINICAL DATA:  Chest pain EXAM: PORTABLE CHEST 1 VIEW COMPARISON:  None Available. FINDINGS: The heart size and mediastinal contours are within normal limits. No consolidation, pneumothorax or effusion. No edema. The visualized skeletal structures are unremarkable. Overlapping cardiac leads. IMPRESSION: No acute cardiopulmonary disease. Electronically Signed   By: Ranell Bring M.D.   On: 12/26/2023 11:53    Procedures Procedures    Medications Ordered in ED Medications - No data to display  ED Course/ Medical Decision Making/ A&P Clinical Course as of 12/26/23 1653  Sun Dec 26, 2023  1156 Chest x-ray interpreted by me as no acute infiltrate.  Awaiting radiology reading. [MB]  1357 Discussed with Dr. Lauro cardiology.  He felt the workup has been reasonable and does not feel he needs admission.  He would recommend starting him on some blood pressure medication amlodipine  10 and he will arrange for follow-up in the office to get a cardiac echo.  I reviewed this with the patient and he is comfortable plan for discharge. [MB]    Clinical Course User Index [MB] Towana Sharper BROCKS, MD                                 Medical Decision Making Amount and/or Complexity of Data Reviewed Labs: ordered. Radiology: ordered.  Risk Prescription drug management.   This patient  complains of intermittent chest pain for a week; this involves an extensive number of treatment Options and is a complaint that carries with it a high risk of complications and morbidity. The differential includes musculoskeletal pain, cardiac, PE, pneumothorax, vascular  I ordered, reviewed and interpreted labs, which included CBC normal chemistries normal other than mildly elevated glucose troponin flat D-dimer negative I ordered imaging studies which included chest x-ray and I independently    visualized and interpreted imaging which showed no acute findings Previous records obtained and reviewed in epic including  urgent care notes and prior cardiac imaging I consulted Dr. Lauro cardiology and discussed lab and imaging findings and discussed disposition.  Cardiac monitoring reviewed, normal sinus rhythm Social determinants considered, no significant barriers Critical Interventions: None  After the interventions stated above, I reevaluated the patient and found patient to be well-appearing in no distress Admission and further testing considered, cardiology recommending starting on blood pressure medications and outpatient follow-up.  Patient comfortable plan.  Will prescribe amlodipine .  Contact information for cardiology given.  Return instructions discussed         Final Clinical Impression(s) / ED Diagnoses Final diagnoses:  Nonspecific chest pain  Heart murmur  Primary hypertension    Rx / DC Orders ED Discharge Orders          Ordered    amLODipine  (NORVASC ) 10 MG tablet  Daily        12/26/23 1358              Towana Ozell BROCKS, MD 12/26/23 1655

## 2023-12-26 NOTE — ED Triage Notes (Signed)
 Pt reports left sided chest pains when working out last week. Pain began again 2 days ago. Denies SOB or cardiac hx. Family hx of CAD.

## 2023-12-26 NOTE — ED Notes (Signed)
 Rhythm strip obtained due to HR, Symptoms and BP.

## 2023-12-26 NOTE — ED Notes (Signed)
 Patient is being discharged from the Urgent Care and sent to the Emergency Department via POV . Per RM, patient is in need of higher level of care due to CP. Patient is aware and verbalizes understanding of plan of care.  Vitals:   12/26/23 1028 12/26/23 1039  BP: (!) 157/78 (!) 144/80  Pulse: 93   Resp: 18   Temp: 98.1 F (36.7 C)   SpO2: 97%

## 2023-12-26 NOTE — Discharge Instructions (Signed)
 You were seen in the emergency department for intermittent chest pain.  You had blood work EKG chest x-ray that did not show any evidence of cardiac injury.  Cardiology is recommending you start on some blood pressure medication and follow-up with them in the office for further evaluation.  If you experience any worsening pain in the meantime please return to the emergency department for further evaluation.

## 2023-12-26 NOTE — ED Triage Notes (Signed)
 This chest pain started about a week ago maybe a little over, I recently started working out but the pain intermittently continues. No sob, dizziness or other symptom during these events. They seem to have started after strenuous activity, but haven't worked out like that in 2 wks.

## 2023-12-26 NOTE — ED Notes (Signed)
 Called lab to check on lab work being ran. Tech advised she would call back.

## 2024-01-07 ENCOUNTER — Encounter: Payer: Self-pay | Admitting: Cardiology

## 2024-01-07 ENCOUNTER — Ambulatory Visit: Payer: 59 | Attending: Cardiology | Admitting: Cardiology

## 2024-01-07 VITALS — BP 144/90 | HR 109 | Resp 16 | Ht 69.0 in | Wt 196.6 lb

## 2024-01-07 DIAGNOSIS — I10 Essential (primary) hypertension: Secondary | ICD-10-CM

## 2024-01-07 DIAGNOSIS — R072 Precordial pain: Secondary | ICD-10-CM | POA: Diagnosis not present

## 2024-01-07 DIAGNOSIS — E782 Mixed hyperlipidemia: Secondary | ICD-10-CM

## 2024-01-07 NOTE — Progress Notes (Signed)
Cardiology Office Note:    Date:  01/07/2024  NAME:  Terrence Santiago    MRN: [id number] DOB:  1981-01-23   PCP:  Genia Del  Former Cardiology Providers: NA Primary Cardiologist:  Tessa Lerner, DO, [facility] (established care 01/07/2024) Electrophysiologist:  None   Referring MD: Jac Canavan, PA-C  Reason of Consult: Chest pain  Chief Complaint  Patient presents with   Chest Pain   New Patient (Initial Visit)    History of Present Illness:    Terrence Santiago is a 43 y.o. African-American male whose past medical history and cardiovascular risk factors includes: Hyperlipidemia, hypertension.   Patient was referred to the practice for evaluation of chest pain after recent ER visit.  Chest pain: Occurred around December 26, 2023.  Couple weeks prior to that he was working out at [employer] and also had a mechanical fall.  Subsequently started noticing left-sided and substernal chest pain more pronounced with positions, moving side-to-side and moving his arms up and down.  Given strong family history of heart disease he went to urgent care and was referred to the emergency room department.  During his ER visit I was contacted for further recommendations.  After review of chart recommended outpatient follow-up.  Since the ER visit patient still has left-sided/anterior precordial discomfort, intensity is 3 out of 10, has improved after taking muscle relaxants.  Denies heart failure symptoms, near-syncope or syncopal events.  Father passed away at the age of 67 due to heart disease and paternal grandmother also had heart disease.  Patient is unable to elaborate on the details at this time.  He was started on Norvasc 10 mg p.o. daily after the ER visit due to concerns for hypertension.  Blood pressure still remains elevated.  He does not check his readings at home.  Current Medications: Current Meds  Medication Sig   amLODipine (NORVASC) 10 MG tablet Take 1 tablet  (10 mg total) by mouth daily.   azithromycin (ZITHROMAX) 500 MG tablet Take 500 mg by mouth daily.   methocarbamol (ROBAXIN) 750 MG tablet Take 1,500 mg by mouth at bedtime as needed.   prednisoLONE acetate (PRED FORTE) 1 % ophthalmic suspension Place 1 drop into the right eye in the morning, at noon, and at bedtime.   promethazine-dextromethorphan (PROMETHAZINE-DM) 6.25-15 MG/5ML syrup Take 5 mLs by mouth every 4 (four) hours as needed.   rosuvastatin (CRESTOR) 10 MG tablet Take 1 tablet (10 mg total) by mouth daily.     Allergies:    Patient has no known allergies.   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   Per CT   Gall stone 2022   per 01/2021 CT scan   History of kidney stones    Hyperlipidemia     Past Surgical History: Past Surgical History:  Procedure Laterality Date   LACERATION REPAIR     left hand   ROBOTIC ASSITED PARTIAL NEPHRECTOMY Left 01/24/2021   Procedure: XI ROBOTIC ASSITED PARTIAL NEPHRECTOMY;  Surgeon: Rene Paci, MD;  Location: [facility];  Service: Urology;  Laterality: Left;    Social History: Social History   Tobacco Use   Smoking status: Never   Smokeless tobacco: Never  Vaping Use   Vaping status: Never Used  Substance Use Topics   Alcohol use: Yes    Alcohol/week: 0.0 standard drinks of alcohol    Comment: rare   Drug use: No    Family  History: Family History  Problem Relation Age of Onset   Hypertension Father    Heart disease Father 33       MI   Cancer Maternal Uncle    Diabetes Maternal Grandmother    Stroke Neg Hx     ROS:   Review of Systems  Cardiovascular:  Positive for chest pain. Negative for claudication, irregular heartbeat, leg swelling, near-syncope, orthopnea, palpitations, paroxysmal nocturnal dyspnea and syncope.  Respiratory:  Negative for shortness of breath.   Hematologic/Lymphatic: Negative for bleeding problem.    EKGs/Labs/Other Studies  Reviewed:   EKG: EKG Interpretation Date/Time:  Friday January 07 2024 14:30:45 EST Ventricular Rate:  100 PR Interval:  168 QRS Duration:  76 QT Interval:  316 QTC Calculation: 407 R Axis:   64  Text Interpretation: Normal sinus rhythm Nonspecific T wave abnormality When compared with ECG of 26-Dec-2023 11:14, No significant change since last tracing Confirmed by Tessa Lerner [phone]) on 01/07/2024 2:50:40 PM  Echocardiogram: NA  CAC July 2024: Coronary calcium score of 0.    Labs:    Latest Ref Rng & Units 12/26/2023   11:39 AM 05/28/2023    8:48 AM 05/25/2022    8:45 AM  CBC  WBC 4.0 - 10.5 K/uL 8.1  8.5  6.4   Hemoglobin 13.0 - 17.0 g/dL 19.1  47.8  29.5   Hematocrit 39.0 - 52.0 % 49.5  48.1  45.7   Platelets 150 - 400 K/uL 357  358  319        Latest Ref Rng & Units 12/26/2023   11:39 AM 05/28/2023    8:48 AM 05/25/2022    8:45 AM  BMP  Glucose 70 - 99 mg/dL 621  89  90   BUN 6 - 20 mg/dL 12  11  11    Creatinine 0.61 - 1.24 mg/dL 3.08  6.57  8.46   BUN/Creat Ratio 9 - 20  11  12    Sodium 135 - 145 mmol/L 137  140  139   Potassium 3.5 - 5.1 mmol/L 4.3  4.6  4.4   Chloride 98 - 111 mmol/L 101  101  102   CO2 22 - 32 mmol/L 28  25  25    Calcium 8.9 - 10.3 mg/dL 9.0  9.7  9.6       Latest Ref Rng & Units 12/26/2023   11:39 AM 05/28/2023    8:48 AM 05/25/2022    8:45 AM  CMP  Glucose 70 - 99 mg/dL 962  89  90   BUN 6 - 20 mg/dL 12  11  11    Creatinine 0.61 - 1.24 mg/dL 9.52  8.41  3.24   Sodium 135 - 145 mmol/L 137  140  139   Potassium 3.5 - 5.1 mmol/L 4.3  4.6  4.4   Chloride 98 - 111 mmol/L 101  101  102   CO2 22 - 32 mmol/L 28  25  25    Calcium 8.9 - 10.3 mg/dL 9.0  9.7  9.6   Total Protein 6.0 - 8.5 g/dL  8.0  7.8   Total Bilirubin 0.0 - 1.2 mg/dL  0.9  0.8   Alkaline Phos 44 - 121 IU/L  100  99   AST 0 - 40 IU/L  23  26   ALT 0 - 44 IU/L  21  21     Lab Results  Component Value Date   CHOL 157 05/28/2023   HDL 39 (L) 05/28/2023  LDLCALC 104 (H) 05/28/2023    TRIG 72 05/28/2023   CHOLHDL 4.0 05/28/2023   No results for input(s): "LIPOA" in the last 8760 hours. No components found for: "NTPROBNP" No results for input(s): "PROBNP" in the last 8760 hours. No results for input(s): "TSH" in the last 8760 hours.  Physical Exam:    Today's Vitals   01/07/24 1427  BP: (!) 144/90  Pulse: (!) 109  Resp: 16  SpO2: 98%  Weight: 196 lb 9.6 oz (89.2 kg)  Height: 5\' 9"  (1.753 m)   Body mass index is 29.03 kg/m. Wt Readings from Last 3 Encounters:  01/07/24 196 lb 9.6 oz (89.2 kg)  12/26/23 189 lb 9.5 oz (86 kg)  12/26/23 190 lb (86.2 kg)    Physical Exam  Constitutional: No distress.  hemodynamically stable  Neck: No JVD present.  Cardiovascular: Normal rate, regular rhythm, S1 normal and S2 normal. Exam reveals no gallop, no S3 and no S4.  No murmur heard. Pulmonary/Chest: Effort normal and breath sounds normal. No stridor. He has no wheezes. He has no rales.  Abdominal: Soft. Bowel sounds are normal. He exhibits no distension. There is no abdominal tenderness.  Musculoskeletal:        General: No edema.     Cervical back: Neck supple.  Neurological: He is alert and oriented to person, place, and time. He has intact cranial nerves (2-12).  Skin: Skin is warm.    Impression & Recommendation(s):  Impression:   ICD-10-CM   1. Precordial pain  R07.2 EKG 12-Lead    ECHOCARDIOGRAM COMPLETE    ECHOCARDIOGRAM STRESS TEST    Cardiac Stress Test: Informed Consent Details: Physician/Practitioner Attestation; Transcribe to consent form and obtain patient signature    2. Benign hypertension  I10     3. Mixed hyperlipidemia  E78.2        Recommendation(s):  Precordial pain Predominantly noncardiac. EKG shows sinus rhythm with nonspecific T wave abnormalities. Risk factors include: Hypertension, family history of heart disease, and hyperlipidemia. Recommend that he follows up with PCP as majority of the upper extremity musculoskeletal  pain.  He may also need additional imaging.  Recommended better blood pressure management. Coronary calcium score in July 2024 was 0. Due to nonspecific EKG changes proceeding with GXT may likely be nondiagnostic.  Therefore stress echocardiogram is requested.  Echo will be ordered to evaluate for structural heart disease and left ventricular systolic function.  Benign hypertension Recently started on amlodipine 10 mg p.o. daily. Office blood pressures are still not at goal. I have asked him to keep a ambulatory log of his blood pressures and if SBP is consistently greater than 120 mmHg to follow-up with PCP for additional medication titration. Currently consumes a high salt diet-at least goes out to eat 5 times a week.  Mixed hyperlipidemia Currently on rosuvastatin 10 mg p.o. daily Lipids managed by PCP.   Orders Placed:  Orders Placed This Encounter  Procedures   Cardiac Stress Test: Informed Consent Details: Physician/Practitioner Attestation; Transcribe to consent form and obtain patient signature    Physician/Practitioner attestation of informed consent for procedure/surgical case:   I, the physician/practitioner, attest that I have discussed with the patient the benefits, risks, side effects, alternatives, likelihood of achieving goals and potential problems during recovery for the procedure that I have provided informed consent.    Procedure:   Stress echocardiogram    Indication/Reason:   Chest pain   EKG 12-Lead   ECHOCARDIOGRAM COMPLETE    Standing Status:  Future    Expected Date:   02/11/2024    Expiration Date:   01/06/2025    Where should this test be performed:   [facility])    Does the patient weigh less than or greater than 250 lbs?:   Patient weighs less than 250 lbs    Perflutren DEFINITY (image enhancing agent) should be administered unless hypersensitivity or allergy exist:   Administer Perflutren    Reason for exam-Echo:   Chest Pain   R07.9   ECHOCARDIOGRAM STRESS TEST    Standing Status:   Future    Expected Date:   02/11/2024    Expiration Date:   01/06/2025    Where should this be performed?:   [facility])    Perflutren DEFINITY (image enhancing agent) should be administered unless hypersensitivity or allergy exist:   Administer Perflutren    Stress with pharmacologic or treadmill ?:   Treadmill w/ exercise    Is patient able to ambulate on a treadmill?:   Yes    Final Medication List:   No orders of the defined types were placed in this encounter.   There are no discontinued medications.   Current Outpatient Medications:    amLODipine (NORVASC) 10 MG tablet, Take 1 tablet (10 mg total) by mouth daily., Disp: 30 tablet, Rfl: 0   azithromycin (ZITHROMAX) 500 MG tablet, Take 500 mg by mouth daily., Disp: , Rfl:    methocarbamol (ROBAXIN) 750 MG tablet, Take 1,500 mg by mouth at bedtime as needed., Disp: , Rfl:    prednisoLONE acetate (PRED FORTE) 1 % ophthalmic suspension, Place 1 drop into the right eye in the morning, at noon, and at bedtime., Disp: , Rfl:    promethazine-dextromethorphan (PROMETHAZINE-DM) 6.25-15 MG/5ML syrup, Take 5 mLs by mouth every 4 (four) hours as needed., Disp: , Rfl:    rosuvastatin (CRESTOR) 10 MG tablet, Take 1 tablet (10 mg total) by mouth daily., Disp: 90 tablet, Rfl: 3  Consent:   Informed Consent   Shared Decision Making/Informed Consent The risks [chest pain, shortness of breath, cardiac arrhythmias, dizziness, blood pressure fluctuations, myocardial infarction, stroke/transient ischemic attack, and life-threatening complications (estimated to be 1 in 10,000)], benefits (risk stratification, diagnosing coronary artery disease, treatment guidance) and alternatives of a stress or dobutamine stress echocardiogram were discussed in detail with Mr. Melchert and he agrees to proceed.     Disposition:   6 months sooner if needed. Patient may be asked to follow-up  sooner based on the results of the above-mentioned testing.  His questions and concerns were addressed to his satisfaction. He voices understanding of the recommendations provided during this encounter.    Signed, Tessa Lerner, DO, [facility]  [facility]  [address] [place], [place] [address] 01/07/2024 3:17 PM

## 2024-01-07 NOTE — Patient Instructions (Signed)
Medication Instructions:  Your physician recommends that you continue on your current medications as directed. Please refer to the Current Medication list given to you today.  *If you need a refill on your cardiac medications before your next appointment, please call your pharmacy*  Lab Work: None ordered today. If you have labs (blood work) drawn today and your tests are completely normal, you will receive your results only by: MyChart Message (if you have MyChart) OR A paper copy in the mail If you have any lab test that is abnormal or we need to change your treatment, we will call you to review the results.  Testing/Procedures: Your physician has requested that you have an echocardiogram in 4-6 weeks. Echocardiography is a painless test that uses sound waves to create images of your heart. It provides your doctor with information about the size and shape of your heart and how well your heart's chambers and valves are working. This procedure takes approximately one hour. There are no restrictions for this procedure. Please do NOT wear cologne, perfume, aftershave, or lotions (deodorant is allowed). Please arrive 15 minutes prior to your appointment time.  Please note: We ask at that you not bring children with you during ultrasound (echo/ vascular) testing. Due to room size and safety concerns, children are not allowed in the ultrasound rooms during exams. Our front office staff cannot provide observation of children in our lobby area while testing is being conducted. An adult accompanying a patient to their appointment will only be allowed in the ultrasound room at the discretion of the ultrasound technician under special circumstances. We apologize for any inconvenience.   ----------------------------------------------------------------------------------------------------------------------------------  Your physician has requested that you have a stress echocardiogram in 4-6 weeks. For  further information please visit [email or web address] Please follow instruction sheet as given.  Please note: We ask at that you not bring children with you during ultrasound (echo/ vascular) testing. Due to room size and safety concerns, children are not allowed in the ultrasound rooms during exams. Our front office staff cannot provide observation of children in our lobby area while testing is being conducted. An adult accompanying a patient to their appointment will only be allowed in the ultrasound room at the discretion of the ultrasound technician under special circumstances. We apologize for any inconvenience.   Follow-Up: At [facility], you and your health needs are our priority.  As part of our continuing mission to provide you with exceptional heart care, we have created designated Provider Care Teams.  These Care Teams include your primary Cardiologist (physician) and Advanced Practice Providers (APPs -  Physician Assistants and Nurse Practitioners) who all work together to provide you with the care you need, when you need it.   Your next appointment:   6 month(s)  The format for your next appointment:   In Person  Provider:   Tessa Lerner, DO {

## 2024-01-17 ENCOUNTER — Ambulatory Visit: Payer: 59 | Admitting: Medical

## 2024-01-17 VITALS — BP 120/72 | HR 99 | Wt 192.4 lb

## 2024-01-17 DIAGNOSIS — R7309 Other abnormal glucose: Secondary | ICD-10-CM | POA: Diagnosis not present

## 2024-01-17 DIAGNOSIS — R079 Chest pain, unspecified: Secondary | ICD-10-CM

## 2024-01-17 LAB — POCT GLYCOSYLATED HEMOGLOBIN (HGB A1C): Hemoglobin A1C: 6.1 % — AB (ref 4.0–5.6)

## 2024-01-17 LAB — GLUCOSE, POCT (MANUAL RESULT ENTRY): POC Glucose: 84 mg/dL (ref 70–99)

## 2024-01-17 NOTE — Patient Instructions (Signed)
Prediabetes means you have a higher than normal blood sugar level. It's not high enough to be considered type 2 diabetes yet, but without making some lifestyle changes you are more likely to develop type 2 diabetes.  If you have prediabetes, the long-term damage of diabetes, especially to your heart, blood vessels and kidneys may already be starting. You may not be able to change certain risk factors such as age, race, or family history, but you CAN make changes to your lifestyle, your eating habits, and your activity.  Although diabetes can develop at any age, the risk of prediabetes increases after age 73.  Your risk of prediabetes increases if you have a parent or sibling with type 2 diabetes.   Although it's unclear why, certain people including Black, Hispanic, American [place] and [place] American people, are more likely to develop prediabetes. ? ?Ways to prevent or slow progression to diabetes: ?Eat healthy foods - Eating red meat and processed meat, and drinking sugar-sweetened beverages, is associated with a higher risk of prediabetes. A diet high in fruits, vegetables, nuts, whole grains and olive oil is associated with a lower risk of prediabetes. ?Get at least 150 minutes of moderate aerobic physical activity a week, or about 30 minutes on most days of the week.  The less active you are, the greater your risk of prediabetes. Physical activity helps you control your weight, uses up sugar for energy and makes the body use insulin more effectively. ?Lose excess weight - Being overweight is a primary risk factor for prediabetes. The more fatty tissue you have, especially inside and between the muscle and skin around your abdomen, the more resistant your cells become to insulin. ?Waist size. A large waist size can indicate insulin resistance. The risk of insulin resistance goes up for men with waists larger than 40 inches and for women with waists larger than 35 inches. ?Control your blood pressure and  cholesterol.  If your blood pressure is not 130/80 or less, discuss with your provider to help get this under control.  It is ideal to have an HDL good cholesterol number >50 and have a LDL bad cholesterol number <100.   ?Don't smoke ?One simple strategy to help you make good food choices and eat appropriate portions sizes is to divide up your plate. These three divisions on your plate promote healthy eating: ? ?One-half: fruit and nonstarchy vegetables ?One-quarter: whole grains ?One-quarter: protein-rich foods, such as legumes, fish or lean meats  ?

## 2024-01-17 NOTE — Progress Notes (Signed)
Subjective: Chief Complaint  Patient presents with   follow-up    Follow-up on labs from ER.    Here for concerns, chest pain.    Few weeks ago had bad chest pains, went to urgent care.  They felt there were EKG changes, and was sent to ED.  Bp was elevated, was started on some medication.  In recent days BP has been looking good.  Has continued to have some chest pains though, was seen at another urgent care while in [place] at a convention recently.  He has a cardiology in outpatient setting and has a stress echocardiogram coming up soon.  His chest pains have mostly resolved  During this recent evaluation he had there was elevated blood sugar.  He had eaten a piece of candy before going there.  He was curious about prediabetes  He had been eating out more although not really unhealthy food but notes he can do better with his diet.  He has gained some weight  No other aggravating or relieving factors. No other complaint.   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 30 tablet 0   prednisoLONE acetate (PRED FORTE) 1 % ophthalmic suspension Place 1 drop into the right eye in the morning, at noon, and at bedtime.     rosuvastatin (CRESTOR) 10 MG tablet Take 1 tablet (10 mg total) by mouth daily. 90 tablet 3   No current facility-administered medications on file prior to visit.   Family History  Problem Relation Age of Onset   Hypertension Father    Heart disease Father 35       MI   Cancer Maternal Uncle    Diabetes Maternal Grandmother    Stroke Neg Hx     ROS as in subjective    Objective: BP 120/72   Pulse 99   Wt 192 lb 6.4 oz (87.3 kg)   SpO2 98%   BMI 28.41 kg/m    Gen: wd, wn, nad Heart rrr, normal s1, s2, no murmurs Lungs clear Pulses wnl No edema    Assessment: Encounter Diagnoses  Name Primary?   Elevated glucose Yes   Chest pain, unspecified type     Plan: I reviewed his recent emergency department notes, EKG findings, cardiology consult notes from 01/07/2024.  Follow-up with stress echocardiogram as planned.  Continue current new blood pressure medicine amlodipine 10 mg daily  Continue Crestor 10 mg daily that he was already on  Thankfully his chest pains have resolved and were likely musculoskeletal  History of impaired glucose-labs today show fasting Glucose 84.  Hemoglobin A1c today is 6.1 %.   Counseled on diet, exercise, trying to prevent prediabetes progression to diabetes  Follow-up yearly for physical  Trayven was seen today for follow-up.  Diagnoses and all orders for this visit:  Elevated glucose  Chest pain, unspecified type     F/u yearly for well visit

## 2024-01-28 ENCOUNTER — Ambulatory Visit (HOSPITAL_COMMUNITY): Payer: 59 | Attending: Cardiology

## 2024-01-28 ENCOUNTER — Encounter: Payer: Self-pay | Admitting: Cardiology

## 2024-01-28 DIAGNOSIS — R072 Precordial pain: Secondary | ICD-10-CM | POA: Insufficient documentation

## 2024-01-28 LAB — ECHOCARDIOGRAM COMPLETE
Area-P 1/2: 3.71 cm2
S' Lateral: 2.2 cm

## 2024-02-14 ENCOUNTER — Encounter (HOSPITAL_COMMUNITY): Payer: Self-pay

## 2024-02-22 ENCOUNTER — Ambulatory Visit (HOSPITAL_COMMUNITY): Payer: 59

## 2024-03-21 ENCOUNTER — Ambulatory Visit (HOSPITAL_COMMUNITY)

## 2024-03-21 ENCOUNTER — Ambulatory Visit (HOSPITAL_COMMUNITY): Attending: Cardiology

## 2024-03-21 DIAGNOSIS — R072 Precordial pain: Secondary | ICD-10-CM | POA: Diagnosis present

## 2024-03-21 MED ORDER — PERFLUTREN LIPID MICROSPHERE
6.0000 mL | INTRAVENOUS | Status: AC | PRN
  Administered 2024-03-21: 6 mL via INTRAVENOUS

## 2024-03-22 ENCOUNTER — Encounter: Payer: Self-pay | Admitting: Cardiology

## 2024-04-24 ENCOUNTER — Ambulatory Visit: Admitting: Medical

## 2024-04-24 VITALS — BP 130/80 | HR 86 | Temp 97.7°F | Wt 197.4 lb

## 2024-04-24 DIAGNOSIS — R109 Unspecified abdominal pain: Secondary | ICD-10-CM | POA: Diagnosis not present

## 2024-04-24 DIAGNOSIS — M546 Pain in thoracic spine: Secondary | ICD-10-CM | POA: Diagnosis not present

## 2024-04-24 DIAGNOSIS — R0789 Other chest pain: Secondary | ICD-10-CM | POA: Diagnosis not present

## 2024-04-24 LAB — POCT URINALYSIS DIP (PROADVANTAGE DEVICE)
Bilirubin, UA: NEGATIVE
Blood, UA: NEGATIVE
Glucose, UA: NEGATIVE mg/dL
Ketones, POC UA: NEGATIVE mg/dL
Leukocytes, UA: NEGATIVE
Nitrite, UA: NEGATIVE
Protein Ur, POC: NEGATIVE mg/dL
Specific Gravity, Urine: 1.015
Urobilinogen, Ur: NEGATIVE
pH, UA: 6 (ref 5.0–8.0)

## 2024-04-24 NOTE — Progress Notes (Signed)
 Subjective:  Terrence Santiago is a 43 y.o. male who presents for Chief Complaint  Patient presents with   Acute Visit    Tenderness on right side near kidneys. tender to touch     Here for intermittent tendnerss on right side for past few months, off and on type of pain.  Pain is located in the right posterior chest wall/back.  No injury, trauma, fall, and doesn't feel muscular.   Not sure of the trigger. No urinary frequency, urgency, no odor or blood in urine.   No bowel issues, has a daily unremarkable BM.   No rash.  He does initially think it is muscle spasm related or muscle related.  He did have some curiosity whether related to his kidneys.  He has had this similar in the past and has had scans related to the same symptoms in the past few years.  No other aggravating or relieving factors.    No other c/o.  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 30 tablet 0   rosuvastatin  (CRESTOR ) 10 MG tablet Take 1 tablet (10 mg total) by mouth daily. 90 tablet 3   prednisoLONE  acetate (PRED FORTE ) 1 % ophthalmic suspension Place 1 drop into the right eye in the morning, at noon, and at bedtime. (Patient not taking: Reported on 04/24/2024)     No current facility-administered medications on file prior to visit.     The following portions of the patient's history were reviewed and updated as appropriate: allergies, current medications, past family history, past medical history, past social history, past surgical history and problem list.  ROS Otherwise as in subjective above  Objective: BP 130/80   Pulse 86   Temp 97.7 F (36.5 C)   Wt 197 lb 6.4 oz (89.5 kg)   BMI 29.15 kg/m   General  appearance: alert, no distress, well developed, well nourished Heart: RRR, normal S1, S2, no murmurs Lungs: CTA bilaterally, no wheezes, rhonchi, or rales Abdomen: +bs, soft, non tender, non distended, no masses, no hepatomegaly, no splenomegaly Back: Mild specific tenderness right posterior chest wall around T9-T11 range but no bruising no swelling no discoloration Pulses: 2+ radial pulses, 2+ pedal pulses, normal cap refill Ext: no edema Skin: No erythema or bruising or rash    Assessment: Encounter Diagnoses  Name Primary?   Chest wall pain Yes   Side pain    Right-sided thoracic back pain, unspecified chronicity      Plan: We discussed possible differential.  Etiology is unclear.  In the past when he had similar pains we had gotten scans and no specific finding was ever revealed.  Discussed the possibility of a kidney issue versus bulging thoracic disc causing radicular symptoms versus musculoskeletal etiology versus other.  Will send for thoracic x-ray.  Urinalysis unremarkable.  Terrence Santiago was seen today for acute visit.  Diagnoses and all orders for this visit:  Chest wall pain -     DG Thoracic Spine 2 View; Future  Side pain -     POCT Urinalysis DIP (Proadvantage Device) -     DG Thoracic Spine 2 View; Future  Right-sided thoracic back pain,  unspecified chronicity -     DG Thoracic Spine 2 View; Future    Follow up: pending xray

## 2024-04-24 NOTE — Patient Instructions (Signed)
 Please go to [facility] for your thoracic xray.   Their hours are 8am - 4:30 pm Monday - Friday.  Take your insurance card with you.  [facility] [phone]   [address] [place], [place] [address]

## 2024-04-26 ENCOUNTER — Ambulatory Visit: Admitting: Medical

## 2024-05-01 ENCOUNTER — Ambulatory Visit
Admission: RE | Admit: 2024-05-01 | Discharge: 2024-05-01 | Disposition: A | Discharge status: Home / Self Care | Source: Ambulatory Visit | Attending: Medical | Admitting: Medical

## 2024-05-01 DIAGNOSIS — R109 Unspecified abdominal pain: Secondary | ICD-10-CM

## 2024-05-01 DIAGNOSIS — M546 Pain in thoracic spine: Secondary | ICD-10-CM

## 2024-05-01 DIAGNOSIS — R0789 Other chest pain: Secondary | ICD-10-CM

## 2024-05-01 NOTE — Progress Notes (Signed)
 X-rays negative.  Symptoms any better today or still the same?

## 2024-05-09 ENCOUNTER — Other Ambulatory Visit: Payer: Self-pay | Admitting: Medical

## 2024-05-09 DIAGNOSIS — R0789 Other chest pain: Secondary | ICD-10-CM

## 2024-05-09 DIAGNOSIS — R109 Unspecified abdominal pain: Secondary | ICD-10-CM

## 2024-05-10 ENCOUNTER — Other Ambulatory Visit: Payer: Self-pay | Admitting: Medical

## 2024-05-10 DIAGNOSIS — R1084 Generalized abdominal pain: Secondary | ICD-10-CM

## 2024-05-10 DIAGNOSIS — R079 Chest pain, unspecified: Secondary | ICD-10-CM

## 2024-05-10 NOTE — Telephone Encounter (Signed)
 Copied from CRM [id number]. Topic: General - Other >> May 10, 2024  9:41 AM [employer] wrote: Reason for CRM: [facility] would like to know if they will be looking at the upper or lower abdomen. The also recommended sending an order for the chest area as well.   Callback #: [id number], Opt 1, 4 Fax #: [phone]

## 2024-05-10 NOTE — Telephone Encounter (Signed)
 I have discontinued the CT abdomen since new order has been placed

## 2024-05-12 ENCOUNTER — Ambulatory Visit: Payer: Self-pay

## 2024-05-12 NOTE — Telephone Encounter (Signed)
 Copied from CRM [id number]. Topic: Clinical - Red Word Triage >> May 12, 2024  8:16 AM Terrence Santiago wrote: Red Word that prompted transfer to Nurse Triage: Patient is calling to report previous chest pain. Ran a lot ot test. Chest pain has returned for 2 days. Please advise  Chief Complaint: breast bone pain, states he can feel where the pain is coming from and it hurts to the touch.  States about 6 weeks ago had stress tests, blood work and Veterinary surgeon and reports cardiac issues were rulled out Symptoms: pain that lasts days at a time Frequency: comes and goes Pertinent Negatives: Patient denies arm pain, jaw pain, sob, diaphoresis,  Disposition: [] ED /[x] Urgent Care (no appt availability in office) / [] Appointment(In office/virtual)/ []  [facility]/ [] Home Care/ [] Refused Recommended Disposition /[] [facility]/ []  Follow-up with PCP Additional Notes: no apt available today; instructed to go to UC.  Pcp office updated; care advice given, denies questions; instructed to go to ER if becomes worse.   Reason for Disposition  [1] Chest pain lasts > 5 minutes AND [2] occurred > 3 days ago (72 hours) AND [3] NO chest pain or cardiac symptoms now  Answer Assessment - Initial Assessment Questions 1. LOCATION: "Where does it hurt?"       States pain is in the chest, states coming from the breast bone 2. RADIATION: "Does the pain go anywhere else?" (e.g., into neck, jaw, arms, back)     back 3. ONSET: "When did the chest pain begin?" (Minutes, hours or days)      2 days ago 4. PATTERN: "Does the pain come and go, or has it been constant since it started?"  "Does it get worse with exertion?"      Comes and goes 5. DURATION: "How long does it last" (e.g., seconds, minutes, hours)     Few days 6. SEVERITY: "How bad is the pain?"  (e.g., Scale 1-10; mild, moderate, or severe)    - MILD (1-3): doesn't interfere with normal activities     - MODERATE (4-7): interferes with normal activities or  awakens from sleep    - SEVERE (8-10): excruciating pain, unable to do any normal activities       mild 7. CARDIAC RISK FACTORS: "Do you have any history of heart problems or risk factors for heart disease?" (e.g., angina, prior heart attack; diabetes, high blood pressure, high cholesterol, smoker, or strong family history of heart disease)     Denies; states cardiac issues were ruled out.  Had stress test and xrays 8. PULMONARY RISK FACTORS: "Do you have any history of lung disease?"  (e.g., blood clots in lung, asthma, emphysema, birth control pills)     denies 9. CAUSE: "What do you think is causing the chest pain?"     Maybe pulled muscles 10. OTHER SYMPTOMS: "Do you have any other symptoms?" (e.g., dizziness, nausea, vomiting, sweating, fever, difficulty breathing, cough)       no 11. PREGNANCY: "Is there any chance you are pregnant?" "When was your last menstrual period?"       na  Protocols used: Chest Pain-A-AH

## 2024-05-23 ENCOUNTER — Ambulatory Visit
Admission: RE | Admit: 2024-05-23 | Discharge: 2024-05-23 | Disposition: A | Discharge status: Home / Self Care | Source: Ambulatory Visit | Attending: Medical | Admitting: Medical

## 2024-05-23 ENCOUNTER — Ambulatory Visit: Payer: Self-pay | Admitting: Medical

## 2024-05-23 DIAGNOSIS — R1084 Generalized abdominal pain: Secondary | ICD-10-CM

## 2024-05-23 DIAGNOSIS — R079 Chest pain, unspecified: Secondary | ICD-10-CM

## 2024-05-23 MED ORDER — IOPAMIDOL (ISOVUE-300) INJECTION 61%
100.0000 mL | Freq: Once | INTRAVENOUS | Status: AC | PRN
  Administered 2024-05-23: 100 mL via INTRAVENOUS

## 2024-05-23 NOTE — Progress Notes (Signed)
 Results sent through MyChart

## 2024-05-28 ENCOUNTER — Other Ambulatory Visit: Payer: Self-pay | Admitting: Medical

## 2024-05-28 DIAGNOSIS — Z0000 Encounter for general adult medical examination without abnormal findings: Secondary | ICD-10-CM

## 2024-05-28 DIAGNOSIS — E785 Hyperlipidemia, unspecified: Secondary | ICD-10-CM

## 2024-05-29 ENCOUNTER — Ambulatory Visit (INDEPENDENT_AMBULATORY_CARE_PROVIDER_SITE_OTHER): Payer: 59 | Admitting: Medical

## 2024-05-29 ENCOUNTER — Encounter: Payer: Self-pay | Admitting: Medical

## 2024-05-29 DIAGNOSIS — E785 Hyperlipidemia, unspecified: Secondary | ICD-10-CM | POA: Diagnosis not present

## 2024-05-29 DIAGNOSIS — Z0000 Encounter for general adult medical examination without abnormal findings: Secondary | ICD-10-CM | POA: Diagnosis not present

## 2024-05-29 MED ORDER — ROSUVASTATIN CALCIUM 10 MG PO TABS: 10.0000 mg | ORAL_TABLET | Freq: Every day | ORAL | 3 refills | Status: AC

## 2024-05-29 NOTE — Progress Notes (Signed)
 Subjective:   HPI  Terrence Santiago is a 43 y.o. male who presents for Chief Complaint  Patient presents with   Annual Exam    Fasting cpe, no concerns, no medical update    Patient Care Team: Jahni Paul, Christiane Cowing, PA-C as PCP - General (Family Medicine) Olinda Bertrand, DO as PCP - Cardiology (Cardiology) Sees dentist Sees eye doctor Dr. Yevonne Heman, [facility] prior   Concerns: Here for well visit.  I saw him recently for chest wall pain.  Still getting the pain some.  It hurts in the sternum and chest wall.  Eating is not can make a difference.  No palpitations of the heart.  No breathing issues.  It hurts sometimes if he moves a certain way.  He cannot seem to reproduce the pain.  He cannot sleep on 1 side because seem to aggravate the pain.  His mattress is relatively new  Back in January 2025 he took his blood pressure medicine for short period time and stopped it.  He thinks at the time his blood pressure elevated was related to stress and worried about chest pains.  He is not currently taking blood pressure medicines blood pressure at home looks normal.  He is compliant with his cholesterol medicine.   Reviewed their medical, surgical, family, social, medication, and allergy history and updated chart as appropriate.  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   Per CT   Elevated blood pressure reading 12/2023   was on amlodipine  for brief period   Gall stone 2022   per 01/2021 CT scan   History of kidney stones    Hyperlipidemia     Past Surgical History:  Procedure Laterality Date   LACERATION REPAIR     left hand   ROBOTIC ASSITED PARTIAL NEPHRECTOMY Left 01/24/2021   Procedure: XI ROBOTIC ASSITED PARTIAL NEPHRECTOMY;  Surgeon: Adelbert Homans, MD;  Location: [facility];  Service: Urology;  Laterality: Left;    Family History  Problem Relation Age of Onset   Hypertension Father    Heart disease Father 59       MI    Cancer Maternal Uncle    Diabetes Maternal Grandmother    Stroke Neg Hx      Current Outpatient Medications:    rosuvastatin  (CRESTOR ) 10 MG tablet, Take 1 tablet (10 mg total) by mouth daily., Disp: 90 tablet, Rfl: 3  No Known Allergies  Review of Systems  Constitutional:  Negative for chills, fever, malaise/fatigue and weight loss.  HENT:  Negative for congestion, ear pain, hearing loss, sore throat and tinnitus.   Eyes:  Negative for blurred vision, pain and redness.  Respiratory:  Negative for cough, hemoptysis and shortness of breath.   Cardiovascular:  Positive for chest pain. Negative for palpitations, orthopnea, claudication and leg swelling.       Chest wall pain  Gastrointestinal:  Negative for abdominal pain, blood in stool, constipation, diarrhea, nausea and vomiting.  Genitourinary:  Negative for dysuria, flank pain, frequency, hematuria and urgency.  Musculoskeletal:  Negative for falls, joint pain and myalgias.  Skin:  Negative for itching and rash.  Neurological:  Negative for dizziness, tingling, speech change, weakness and headaches.  Endo/Heme/Allergies:  Negative for polydipsia. Does not bruise/bleed easily.  Psychiatric/Behavioral:  Negative for depression and memory loss. The patient is not nervous/anxious and does not have insomnia.         05/29/2024    8:10 AM 01/17/2024   11:41 AM  05/28/2023    8:26 AM 05/25/2022    8:34 AM 01/09/2022   11:55 AM  Depression screen PHQ 2/9  Decreased Interest 0 0 0 0 0  Down, Depressed, Hopeless 0 0 0 0 0  PHQ - 2 Score 0 0 0 0 0        Objective:  BP 124/78   Pulse 98   Ht 5' 10.25" (1.784 m)   Wt 196 lb 3.2 oz (89 kg)   SpO2 99%   BMI 27.95 kg/m   Wt Readings from Last 3 Encounters:  05/29/24 196 lb 3.2 oz (89 kg)  04/24/24 197 lb 6.4 oz (89.5 kg)  01/17/24 192 lb 6.4 oz (87.3 kg)   BP Readings from Last 3 Encounters:  05/29/24 124/78  04/24/24 130/80  01/17/24 120/72    General appearance: alert, no  distress, WD/WN, African American male Skin: unremarkable, right upper arm laterally with "scooter" tattoo HEENT: normocephalic, conjunctiva/corneas normal, sclerae anicteric, PERRLA, EOMi, nares patent, no discharge or erythema, pharynx normal Oral cavity: MMM, tongue normal, teeth normal Neck: supple, no lymphadenopathy, no thyromegaly, no masses, normal ROM, no bruits Chest: non tender, normal shape and expansion Heart: RRR, normal S1, S2, no murmurs Lungs: CTA bilaterally, no wheezes, rhonchi, or rales Abdomen: +bs, soft, non tender, non distended, no masses, no hepatomegaly, no splenomegaly, no bruits Back: non tender, normal ROM, no scoliosis Musculoskeletal: upper extremities non tender, no obvious deformity, normal ROM throughout, lower extremities non tender, no obvious deformity, normal ROM throughout Extremities: no edema, no cyanosis, no clubbing Pulses: 2+ symmetric, upper and lower extremities, normal cap refill Neurological: alert, oriented x 3, CN2-12 intact, strength normal upper extremities and lower extremities, sensation normal throughout, DTRs 2+ throughout, no cerebellar signs, gait normal Psychiatric: normal affect, behavior normal, pleasant  GU: normal male external genitalia,circumcised, nontender, no masses, no hernia, no lymphadenopathy Rectal: deferred     Assessment and Plan :   Encounter Diagnoses  Name Primary?   Routine general medical examination at a health care facility    Dyslipidemia, goal LDL below 130      This visit was a preventative care visit, also known as wellness visit or routine physical.   Topics typically include healthy lifestyle, diet, exercise, preventative care, vaccinations, sick and well care, proper use of emergency dept and after hours care, as well as other concerns.     Recommendations: Continue to return yearly for your annual wellness and preventative care visits.  This gives us  a chance to discuss healthy lifestyle,  exercise, vaccinations, review your chart record, and perform screenings where appropriate.  I recommend you see your eye doctor yearly for routine vision care.  I recommend you see your dentist yearly for routine dental care including hygiene visits twice yearly.   Vaccination recommendations were reviewed Immunization History  Administered Date(s) Administered   Influenza,inj,Quad PF,6+ Mos 11/22/2014   PFIZER(Purple Top)SARS-COV-2 Vaccination 01/10/2020, 01/31/2020   Tdap 05/13/2020   Advised yearly flu shot  Screening for cancer: Colon cancer screening: Age 78  Testicular cancer screening You should do a monthly self testicular exam if you are between 94-67 years old  We discussed PSA, prostate exam, and prostate cancer screening risks/benefits.   Age 85  Skin cancer screening: Check your skin regularly for new changes, growing lesions, or other lesions of concern Come in for evaluation if you have skin lesions of concern.  Lung cancer screening: If you have a greater than 20 pack year history of tobacco use,  then you may qualify for lung cancer screening with a chest CT scan.   Please call your insurance company to inquire about coverage for this test.  We currently don't have screenings for other cancers besides breast, cervical, colon, and lung cancers.  If you have a strong family history of cancer or have other cancer screening concerns, please let me know.    Bone health: Get at least 150 minutes of aerobic exercise weekly Get weight bearing exercise at least once weekly Bone density test:  A bone density test is an imaging test that uses a type of X-ray to measure the amount of calcium  and other minerals in your bones. The test may be used to diagnose or screen you for a condition that causes weak or thin bones (osteoporosis), predict your risk for a broken bone (fracture), or determine how well your osteoporosis treatment is working. The bone density test is  recommended for females 65 and older, or females or males <65 if certain risk factors such as thyroid disease, long term use of steroids such as for asthma or rheumatological issues, vitamin D deficiency, estrogen deficiency, family history of osteoporosis, self or family history of fragility fracture in first degree relative.    Heart health: Get at least 150 minutes of aerobic exercise weekly Limit alcohol It is important to maintain a healthy blood pressure and healthy cholesterol numbers  Heart disease screening: Screening for heart disease includes screening for blood pressure, fasting lipids, glucose/diabetes screening, BMI height to weight ratio, reviewed of smoking status, physical activity, and diet.    Goals include blood pressure 120/80 or less, maintaining a healthy lipid/cholesterol profile, preventing diabetes or keeping diabetes numbers under good control, not smoking or using tobacco products, exercising most days per week or at least 150 minutes per week of exercise, and eating healthy variety of fruits and vegetables, healthy oils, and avoiding unhealthy food choices like fried food, fast food, high sugar and high cholesterol foods.    CT calcium  test score of zero 06/23/23  Echocardiogram 01/28/24: EF 60-65%, no regional wall motion abnormality, normal study.   Medical care options: I recommend you continue to seek care here first for routine care.  We try really hard to have available appointments Monday through Friday daytime hours for sick visits, acute visits, and physicals.  Urgent care should be used for after hours and weekends for significant issues that cannot wait till the next day.  The emergency department should be used for significant potentially life-threatening emergencies.  The emergency department is expensive, can often have long wait times for less significant concerns, so try to utilize primary care, urgent care, or telemedicine when possible to avoid  unnecessary trips to the emergency department.  Virtual visits and telemedicine have been introduced since the pandemic started in 2020, and can be convenient ways to receive medical care.  We offer virtual appointments as well to assist you in a variety of options to seek medical care.    Separate significant issues discussed: Hyperlipidemia -continue Crestor  10 mg daily  Elevated blood pressures back in January 2025.  Looking back he feels all of this blood pressure elevation back in January 2025 was related to stress.  His blood pressure is normal today and he is getting normal readings at home.  He is not taking his blood pressure medicine and does not plan to for the time being.  He continues to have a chest wall pain-we reviewed his CT scan he had from 05/23/24.  Reassured no worrisome tumor or PE.  We discussed the mild DISH findings.   Recommend he get back to doing light weight high repetition exercises this week including butterflies, chest press, shoulder press, back rows and stretching.  Consider massage.  Consider over-the-counter Tylenol  this week.  He has muscle relaxer at home he will use at night for the next few nights.  If still not improving over the next week consider referral to orthopedics or physical medicine or PT  We reviewed over blood work he has had in recent months.  He declines any other blood work today  Daniil was seen today for annual exam.  Diagnoses and all orders for this visit:  Routine general medical examination at a health care facility -     rosuvastatin  (CRESTOR ) 10 MG tablet; Take 1 tablet (10 mg total) by mouth daily.  Dyslipidemia, goal LDL below 130 -     rosuvastatin  (CRESTOR ) 10 MG tablet; Take 1 tablet (10 mg total) by mouth daily.    Follow-up pending labs, yearly for physical

## 2025-05-31 ENCOUNTER — Encounter: Payer: Self-pay | Admitting: Medical
